# Patient Record
Sex: Female | Born: 1955 | Race: White | Hispanic: No | Smoking: Current every day smoker
Health system: Southern US, Community
[De-identification: ages and names within clinical notes are randomized; demographics above are authoritative.]

## PROBLEM LIST (undated history)

## (undated) DIAGNOSIS — F32A Depression, unspecified: Secondary | ICD-10-CM

## (undated) DIAGNOSIS — G35 Multiple sclerosis: Secondary | ICD-10-CM

## (undated) DIAGNOSIS — E7800 Pure hypercholesterolemia, unspecified: Secondary | ICD-10-CM

## (undated) HISTORY — PX: TONSILLECTOMY: SUR1361

## (undated) HISTORY — DX: Multiple sclerosis: G35

## (undated) HISTORY — PX: BREAST REDUCTION SURGERY: SHX8

## (undated) HISTORY — PX: MASTECTOMY: SHX3

## (undated) HISTORY — PX: LAPAROSCOPIC ABDOMINAL EXPLORATION: SHX6249

## (undated) HISTORY — DX: Pure hypercholesterolemia, unspecified: E78.00

## (undated) HISTORY — PX: OTHER SURGICAL HISTORY: SHX169

## (undated) HISTORY — DX: Depression, unspecified: F32.A

---

## 2006-12-19 DIAGNOSIS — C50919 Malignant neoplasm of unspecified site of unspecified female breast: Secondary | ICD-10-CM

## 2006-12-19 HISTORY — DX: Malignant neoplasm of unspecified site of unspecified female breast: C50.919

## 2020-12-22 ENCOUNTER — Ambulatory Visit (INDEPENDENT_AMBULATORY_CARE_PROVIDER_SITE_OTHER): Payer: Medicare Other | Admitting: Neurology

## 2020-12-22 ENCOUNTER — Encounter: Payer: Self-pay | Admitting: Neurology

## 2020-12-22 ENCOUNTER — Telehealth: Payer: Self-pay | Admitting: Neurology

## 2020-12-22 ENCOUNTER — Other Ambulatory Visit: Payer: Self-pay | Admitting: Neurology

## 2020-12-22 VITALS — BP 171/89 | HR 87 | Ht 64.5 in | Wt 113.6 lb

## 2020-12-22 DIAGNOSIS — Z79899 Other long term (current) drug therapy: Secondary | ICD-10-CM

## 2020-12-22 DIAGNOSIS — M25511 Pain in right shoulder: Secondary | ICD-10-CM | POA: Diagnosis not present

## 2020-12-22 DIAGNOSIS — E785 Hyperlipidemia, unspecified: Secondary | ICD-10-CM

## 2020-12-22 DIAGNOSIS — R5383 Other fatigue: Secondary | ICD-10-CM | POA: Diagnosis not present

## 2020-12-22 DIAGNOSIS — G8929 Other chronic pain: Secondary | ICD-10-CM

## 2020-12-22 DIAGNOSIS — G35 Multiple sclerosis: Secondary | ICD-10-CM | POA: Diagnosis not present

## 2020-12-22 DIAGNOSIS — E559 Vitamin D deficiency, unspecified: Secondary | ICD-10-CM | POA: Diagnosis not present

## 2020-12-22 DIAGNOSIS — R269 Unspecified abnormalities of gait and mobility: Secondary | ICD-10-CM

## 2020-12-22 DIAGNOSIS — M542 Cervicalgia: Secondary | ICD-10-CM

## 2020-12-22 DIAGNOSIS — M79604 Pain in right leg: Secondary | ICD-10-CM

## 2020-12-22 MED ORDER — GILENYA 0.5 MG PO CAPS: 1.0000 | ORAL_CAPSULE | Freq: Every day | ORAL | 3 refills | Status: DC

## 2020-12-22 MED ORDER — ALPRAZOLAM 0.5 MG PO TABS: ORAL_TABLET | ORAL | 0 refills | Status: DC

## 2020-12-22 NOTE — Telephone Encounter (Signed)
Medicare/bcbs supp order sent to GI. No auth they will reach out to the patient to schedule.  

## 2020-12-22 NOTE — Telephone Encounter (Signed)
Prescription sent to this pharmacy

## 2020-12-22 NOTE — Telephone Encounter (Signed)
Pt called, Dr. Epimenio Foot ask me to call back with information on the prescription bottle for Fingolimod HCl (GILENYA) 0.5 MG CAPS. "Rx [facility] Prescription no: [id number] Manufacturer: [employer] no: [phone]." Can contact me if you have any questions.

## 2020-12-22 NOTE — Progress Notes (Signed)
[facility]  PATIENT: Cynthia Hall DOB: [birth year]  REFERRING DOCTOR OR PCP: Otto Herb  MD [facility] neurology) SOURCE: Patient, notes from [facility] neurology, imaging and lab reports, MRI images personally reviewed.  _________________________________   HISTORICAL  CHIEF COMPLAINT:  Chief Complaint  Patient presents with  . New Patient (Initial Visit)    RM 13. Paper referral from Megan Salon for MS. DMT: Gilenya. Has balance/ambulation problems. Uses cane while she is outside. Has hx falls. Having shocking pains in feet daily.     HISTORY OF PRESENT ILLNESS:  I had the pleasure of seeing your patient, Cynthia Hall, at the [place] [facility] at [facility] for neurologic consultation regarding her multiple sclerosis.  She is a 65 year old woman who was diagnosed with MS in 1995 after presenting with right arm numbness and twitching in the arm.  MRI of the brain and spine was consistent with MS.   She felt she did not have MS so did not start any medication.   In 2011 she had more symptoms and had an MRI performed which showed changes c/w MS.   She was initially started on an interferon.   She switched to Copaxone but did not tolerate it well.   She switched to [place] in 2013 and has continued on it.  She feels her MS has been stable and she has no definite exacerbation.  She is reporting pain in her right back and leg to the thigh towards the knee.  She went to the urgent care in October 2021 but due to being on Gilenya, she did not receive any steroid.   Pain is doing better the past month.   An MRI was going to be done but she never heard back to schedule.   She also has a lot of shoulder pain.  Pain is worse when she moves her neck   Currently, she has a reduced balance and has had some falls.   She notes mild right leg weakness.    She has no numbness.   She has urinary urgency and occasional urge incontinence.   She notes reduced visual  acuity, right worse then left.  Even with glasses she is not 20/20.  She notes normal color vision.     She notes no major issues with fatigue.    She sleeps well most nights.   She has noted mild cognitive issues - especially coming up with the right words.   She has had depression but feels mood is currently fine.    She had left breast cancer and had bilateral mastectomy in 2008.   She did not need chemotherapy.    She has high cholesterol.   She does not have a PCP.     Imaging review: MRI of the brain 07/14/2010 shows multiple T2/FLAIR hyperintense foci predominantly in the periventricular white matter, radially oriented to the ventricles.  One focus is also noted in the right middle cerebellar peduncle adjacent to the ventricle.  None of the foci enhance after contrast was administered.  MRI of the cervical spine 07/14/2010 shows T2 hyperintense foci predominantly in the upper cervical spine.  Foci noted towards the right adjacent to C3 to C4C5 and centrally adjacent to C5.   Spinal stenosis (mild ) at [facility] and C6C7.   REVIEW OF SYSTEMS: Constitutional: No fevers, chills, sweats, or change in appetite.  Mild fatigue. Eyes: She notes poor vision.  No diplopia.  No eye pain.   Ear, nose and throat: No  hearing loss, ear pain, nasal congestion, sore throat Cardiovascular: No chest pain, palpitations Respiratory: No shortness of breath at rest or with exertion.   No wheezes GastrointestinaI: No nausea, vomiting, diarrhea, abdominal pain, fecal incontinence Genitourinary: No dysuria, urinary retention or frequency.  No nocturia. Musculoskeletal:Neck pain, back pain and right shoulder pain.   Integumentary: No rash, pruritus, skin lesions Neurological: as above Psychiatric: No depression at this time.  No anxiety Endocrine: No palpitations, diaphoresis, change in appetite, change in weigh or increased thirst Hematologic/Lymphatic: No anemia, purpura, petechiae. Allergic/Immunologic: No  itchy/runny eyes, nasal congestion, recent allergic reactions, rashes  ALLERGIES: Allergies  Allergen Reactions  . Aspirin   . Betaseron [Interferon Beta-1b]     inj site fatigue  . Biaxin [Clarithromycin]   . Codeine   . Copaxone [Glatiramer Acetate]     Passed out, incont of bladder/bowel  . Effexor [Venlafaxine]   . Latex   . Penicillins   . Topamax [Topiramate]   . Zoloft [Sertraline]     HOME MEDICATIONS:  Current Outpatient Medications:  .  COLESTIPOL HCL PO, Take 10 mg by mouth daily., Disp: , Rfl:  .  Fingolimod HCl (GILENYA) 0.5 MG CAPS, Take 1 capsule by mouth daily., Disp: , Rfl:  .  Ginkgo Biloba 120 MG TABS, Take 120 mg by mouth daily., Disp: , Rfl:  .  Multiple Vitamin (MULTIVITAMIN ADULT PO), Take 1 tablet by mouth daily., Disp: , Rfl:  .  Multiple Vitamins-Minerals (ZINC PO), Take 50 mg by mouth daily., Disp: , Rfl:  .  Omega-3 Fatty Acids (FISH OIL PO), Take 1,400 mg by mouth daily., Disp: , Rfl:  .  simvastatin (ZOCOR) 20 MG tablet, Take 20 mg by mouth daily., Disp: , Rfl:   PAST MEDICAL HISTORY: Past Medical History:  Diagnosis Date  . Breast cancer (HCC) 2008  . Multiple sclerosis (HCC)     PAST SURGICAL HISTORY:   FAMILY HISTORY: No family history on file.  SOCIAL HISTORY:  Social History   Socioeconomic History  . Marital status: Widowed    Spouse name: Not on file  . Number of children: Not on file  . Years of education: Not on file  . Highest education level: Not on file  Occupational History  . Occupation: Disabled   Tobacco Use  . Smoking status: Current Every Day Smoker    Packs/day: 1.00  . Smokeless tobacco: Not on file  Substance and Sexual Activity  . Alcohol use: Yes    Comment: 2 drinks per week  . Drug use: Not on file  . Sexual activity: Not on file  Other Topics Concern  . Not on file  Social History Narrative  . Not on file   Social Determinants of Health   Financial Resource Strain: Not on file  Food  Insecurity: Not on file  Transportation Needs: Not on file  Physical Activity: Not on file  Stress: Not on file  Social Connections: Not on file  Intimate Partner Violence: Not on file     PHYSICAL EXAM  Vitals:   12/22/20 1323  Weight: 113 lb 9.6 oz (51.5 kg)  Height: 5' 4.5" (1.638 m)    Body mass index is 19.2 kg/m.   Hearing Screening   125Hz  250Hz  500Hz  1000Hz  2000Hz  3000Hz  4000Hz  6000Hz  8000Hz   Right ear:           Left ear:             Visual Acuity Screening   Right eye Left  eye Both eyes  Without correction:     With correction: 20/50 20/50 20/40      General: The patient is well-developed and well-nourished and in no acute distress  HEENT:  Head is [place]/AT.  Sclera are anicteric.  Funduscopic exam shows normal optic discs and retinal vessels.  Neck: No carotid bruits are noted.  The neck is slightly tender with a mildly reduced range of motion..  Cardiovascular: The heart has a regular rate and rhythm with a normal S1 and S2. There were no murmurs, gallops or rubs.    Skin: Extremities are without rash or  edema.  Musculoskeletal:  Back is nontender.  She is tender over the subacromial bursa of the right shoulder.  Range of motion is reduced in the right shoulder.  Neurologic Exam  Mental status: The patient is alert and oriented x 3 at the time of the examination. The patient has apparent normal recent and remote memory, with an apparently normal attention span and concentration ability.   Speech is normal.  Cranial nerves: Extraocular movements are full. Pupils are equal, round, and reactive to light and accomodation.  Colors symmetric.   Facial symmetry is present. There is good facial sensation to soft touch bilaterally.Facial strength is normal.  Trapezius and sternocleidomastoid strength is normal. No dysarthria is noted.   No obvious hearing deficits are noted.  Motor:  Muscle bulk is normal.   Tone is normal. Strength is  5 / 5 in all 4 extremities.    Sensory: Sensory testing is intact to pinprick, soft touch and vibration sensation in all 4 extremities.  Coordination: Cerebellar testing reveals good finger-nose-finger and heel-to-shin bilaterally.  Gait and station: Station is normal.   Gait is slightly wide. Tandem gait is poor Romberg is negative.   Reflexes: Deep tendon reflexes are symmetric and normal bilaterally in arms and increased at knees.  No ankle clonus. .   Plantar responses are flexor.       ASSESSMENT AND PLAN  Multiple sclerosis ([place]) - Plan: CBC with Differential/Platelet, Comprehensive metabolic panel, TSH, MR CERVICAL SPINE WO CONTRAST, MR BRAIN W WO CONTRAST  Vitamin D deficiency - Plan: VITAMIN D 25 Hydroxy (Vit-D Deficiency, Fractures)  Other fatigue - Plan: TSH  Gait disturbance - Plan: MR CERVICAL SPINE WO CONTRAST, MR BRAIN W WO CONTRAST  Chronic right shoulder pain - Plan: MR CERVICAL SPINE WO CONTRAST  Right leg pain  Neck pain - Plan: MR CERVICAL SPINE WO CONTRAST  High risk medication use - Plan: CBC with Differential/Platelet, Comprehensive metabolic panel, TSH  Hyperlipidemia, unspecified hyperlipidemia type - Plan: Lipid Panel   In summary, Ms. Jewell is a 65 year old woman with multiple sclerosis initially diagnosed in 1995 who has been on medication since 2011.  She has been on Gilenya for about 7 or 8 years and has tolerated it well.  She has not noted any exacerbations.  Therefore, for the time being we will continue this medication.  She has had no recent exacerbations.  She has had some increase in neck pain, right shoulder pain and also was experiencing probable lumbar radiculopathy, since improved.  I will check an MRI of the brain and cervical spine.  Determine if she is having any breakthrough activity.  If this is occurring we would need to consider a different disease modifying therapy.  The MRI of the cervical spine can also help to evaluate the neck and shoulder pain.  She is  tender over the subacromial bursa of the shoulder and  I did a shoulder bursa injection with 40 mg Depo-Medrol and 2.5 cc lidocaine.  She tolerated the procedure well.  A couple minutes later, her pain was much better in the arm and she had improved range of motion.  We discussed having her see orthopedics if shoulder pain worsens.  I will check some blood work including CBC with differential, CMP, TSH and vitamin D.  She is advised to stay active and exercise as tolerated.  She will return to see me in 6 months or sooner if there are new or worsening neurologic symptoms.    Thank you for asking me to see Ms. Landess.  Please let me know can be of further assistance with her or other patients in the future.   Marvel Sapp A. Felecia Shelling, MD, [facility] [id number], [id number] PM Certified in Neurology, Clinical Neurophysiology, Sleep Medicine and Neuroimaging  [facility] [address] [place], [place] [address] [phone]

## 2020-12-23 ENCOUNTER — Telehealth: Payer: Self-pay | Admitting: *Deleted

## 2020-12-23 LAB — CBC WITH DIFFERENTIAL/PLATELET
Basophils Absolute: 0 10*3/uL (ref 0.0–0.2)
Basos: 0 %
EOS (ABSOLUTE): 0 10*3/uL (ref 0.0–0.4)
Eos: 1 %
Hematocrit: 45.5 % (ref 34.0–46.6)
Hemoglobin: 15.7 g/dL (ref 11.1–15.9)
Immature Grans (Abs): 0 10*3/uL (ref 0.0–0.1)
Immature Granulocytes: 0 %
Lymphocytes Absolute: 0.4 10*3/uL — ABNORMAL LOW (ref 0.7–3.1)
Lymphs: 6 %
MCH: 35.2 pg — ABNORMAL HIGH (ref 26.6–33.0)
MCHC: 34.5 g/dL (ref 31.5–35.7)
MCV: 102 fL — ABNORMAL HIGH (ref 79–97)
Monocytes Absolute: 0.7 10*3/uL (ref 0.1–0.9)
Monocytes: 12 %
Neutrophils Absolute: 4.6 10*3/uL (ref 1.4–7.0)
Neutrophils: 81 %
Platelets: 239 10*3/uL (ref 150–450)
RBC: 4.46 x10E6/uL (ref 3.77–5.28)
RDW: 11.8 % (ref 11.7–15.4)
WBC: 5.7 10*3/uL (ref 3.4–10.8)

## 2020-12-23 LAB — LIPID PANEL
Chol/HDL Ratio: 1.8 ratio (ref 0.0–4.4)
Cholesterol, Total: 219 mg/dL — ABNORMAL HIGH (ref 100–199)
HDL: 121 mg/dL (ref >39)
LDL Chol Calc (NIH): 86 mg/dL (ref 0–99)
Triglycerides: 67 mg/dL (ref 0–149)
VLDL Cholesterol Cal: 12 mg/dL (ref 5–40)

## 2020-12-23 LAB — COMPREHENSIVE METABOLIC PANEL
ALT: 40 IU/L — ABNORMAL HIGH (ref 0–32)
AST: 57 IU/L — ABNORMAL HIGH (ref 0–40)
Albumin/Globulin Ratio: 2.1 (ref 1.2–2.2)
Albumin: 4.9 g/dL — ABNORMAL HIGH (ref 3.8–4.8)
Alkaline Phosphatase: 111 IU/L (ref 44–121)
BUN/Creatinine Ratio: 8 — ABNORMAL LOW (ref 12–28)
BUN: 5 mg/dL — ABNORMAL LOW (ref 8–27)
Bilirubin Total: 0.7 mg/dL (ref 0.0–1.2)
CO2: 25 mmol/L (ref 20–29)
Calcium: 9.6 mg/dL (ref 8.7–10.3)
Chloride: 96 mmol/L (ref 96–106)
Creatinine, Ser: 0.59 mg/dL (ref 0.57–1.00)
GFR calc Af Amer: 111 mL/min/{1.73_m2} (ref >59)
GFR calc non Af Amer: 96 mL/min/{1.73_m2} (ref >59)
Globulin, Total: 2.3 g/dL (ref 1.5–4.5)
Glucose: 107 mg/dL — ABNORMAL HIGH (ref 65–99)
Potassium: 4.1 mmol/L (ref 3.5–5.2)
Sodium: 137 mmol/L (ref 134–144)
Total Protein: 7.2 g/dL (ref 6.0–8.5)

## 2020-12-23 LAB — VITAMIN D 25 HYDROXY (VIT D DEFICIENCY, FRACTURES): Vit D, 25-Hydroxy: 50.1 ng/mL (ref 30.0–100.0)

## 2020-12-23 LAB — TSH: TSH: 1.01 u[IU]/mL (ref 0.450–4.500)

## 2020-12-23 NOTE — Telephone Encounter (Signed)
-----   Message from Asa Lente, MD sent at 12/23/2020  8:24 AM EST ----- Please let the patient know that the lab work is mostly fine.   The liver tests were slightly elevated but not enough to be concerned about.   If taking a lot of Tylenol or drinking alcohol try to reduce.     The cholesterol was mildly elevated so continue Zocor

## 2020-12-23 NOTE — Telephone Encounter (Signed)
Called and spoke with pt about results per Dr. Epimenio Foot note. Pt verbalized understanding. I made her aware Dr. Epimenio Foot called in Gilenya refill for her after she called yesterday. She is scheduled for MRI 01/08/21. Also made her aware Dr. Epimenio Foot called in xanax for her to take prior to MRI. Went over instructions on how to take this and advised she must have driver to and from test.

## 2021-01-02 ENCOUNTER — Other Ambulatory Visit: Payer: Medicare Other

## 2021-01-08 ENCOUNTER — Other Ambulatory Visit: Payer: Medicare Other

## 2021-01-19 ENCOUNTER — Ambulatory Visit
Admission: RE | Admit: 2021-01-19 | Discharge: 2021-01-19 | Disposition: A | Discharge status: Home / Self Care | Payer: Medicare Other | Source: Ambulatory Visit | Attending: Neurology | Admitting: Neurology

## 2021-01-19 DIAGNOSIS — R269 Unspecified abnormalities of gait and mobility: Secondary | ICD-10-CM

## 2021-01-19 DIAGNOSIS — G35 Multiple sclerosis: Secondary | ICD-10-CM

## 2021-01-19 DIAGNOSIS — G8929 Other chronic pain: Secondary | ICD-10-CM

## 2021-01-19 DIAGNOSIS — M542 Cervicalgia: Secondary | ICD-10-CM

## 2021-01-19 MED ORDER — GADOBENATE DIMEGLUMINE 529 MG/ML IV SOLN
10.0000 mL | Freq: Once | INTRAVENOUS | Status: AC | PRN
  Administered 2021-01-19: 10 mL via INTRAVENOUS

## 2021-01-21 ENCOUNTER — Telehealth: Payer: Self-pay | Admitting: *Deleted

## 2021-01-21 DIAGNOSIS — G8929 Other chronic pain: Secondary | ICD-10-CM

## 2021-01-21 DIAGNOSIS — M542 Cervicalgia: Secondary | ICD-10-CM

## 2021-01-21 MED ORDER — METHYLPREDNISOLONE 4 MG PO TBPK: ORAL_TABLET | ORAL | 0 refills | Status: DC

## 2021-01-21 NOTE — Telephone Encounter (Signed)
Called pt and relayed results per Dr. Felecia Shelling note. She verbalized understanding. However, she said she had imaging ordered by [facility] in 2018 as well. Wants to know if he took a look at these.   She also reports that since he did injection in right shoulder. Since, she is able to lift arm up but cannot pick up a cup of coffee without there being pain. She is in constant pain. Advised I will speak with MD and call back with recommendation.  I reviewed Dr. Felecia Shelling last note. He mentioned: "She is tender over the subacromial bursa of the shoulder and I did a shoulder bursa injection with 40 mg Depo-Medrol and 2.5 cc lidocaine.  She tolerated the procedure well.  A couple minutes later, her pain was much better in the arm and she had improved range of motion.  We discussed having her see orthopedics if shoulder pain worsens."

## 2021-01-21 NOTE — Telephone Encounter (Addendum)
Spoke with Dr. Felecia Shelling, he is going to see if Cynthia Hall can get copy of 2018 MRI report/CD to review. The good thing is that there is no progression when compared to 2011. Gilenya is working well for her and she should continue this. Due to continued pain in shoulder, he would like to refer her to orthopaedics for further evaluation.  I called pt and relayed above message. She verbalized understanding. She also mentioned she is having R sciatic pain. I placed her on hold and spoke with MD. Cynthia Hall per MD that we will send in steroid pack for her to take. If sx have not improved by next week, she is to call and Dr. Felecia Shelling will order further imaging on her.  She advised she has "had a lot of imaging in the past". Unsure if she has had MRI lumbar/thoracic. Advised we will include this in request to [facility] when we ask for 2018 imaging. She verbalized understanding.

## 2021-01-21 NOTE — Telephone Encounter (Signed)
-----   Message from Britt Bottom, MD sent at 01/20/2021  6:22 PM EST ----- Please let her know that I took a look at the MRI that she just had and compared it to the MRI she had in 2011 at [facility] neurology.  The MRI of the brain showed no new lesions compared to 2011 though there is some more atrophy.  Therefore, she should continue on the Gilenya since it has prevented new lesions.  The MRI of the cervical spine did not show any new lesions (she has 69 old ones that were present in 2011) she does have spinal stenosis at C5-C6 that has slightly progressed compared to 2011 and could be related to some of her neck pain.

## 2021-01-21 NOTE — Addendum Note (Signed)
Addended by: Wyvonnia Lora on: 01/21/2021 09:25 AM   Modules accepted: Orders

## 2021-01-21 NOTE — Telephone Encounter (Signed)
Request faxed to [facility] [phone] request mri cd and reports from 2018.

## 2021-02-08 NOTE — Telephone Encounter (Signed)
I received reports of an MRI of the brain from 10/09/2017 performed in [place].  It was reportedly stable compared to the MRI from 05/09/2014.  MRI of the cervical spine 10/09/2017 showed a focus from C3-C5, unchanged compared to 2015 and chronic degenerative changes throughout the spine that were also felt to be stable

## 2021-05-14 ENCOUNTER — Telehealth: Payer: Self-pay | Admitting: Neurology

## 2021-05-14 NOTE — Telephone Encounter (Signed)
Pt called needing a 90 day supply refill on her simvastatin (ZOCOR) 20 MG tablet sent in to the [facility] in [place] on [address]

## 2021-05-19 ENCOUNTER — Other Ambulatory Visit: Payer: Self-pay | Admitting: Emergency Medicine

## 2021-05-19 MED ORDER — SIMVASTATIN 20 MG PO TABS: 20.0000 mg | ORAL_TABLET | Freq: Every day | ORAL | 0 refills | Status: DC

## 2021-05-19 NOTE — Telephone Encounter (Signed)
Called patient and let her know her prescription was ready and could be picked up at her convenience.  Marland Kitchen

## 2021-05-19 NOTE — Telephone Encounter (Signed)
Pt has called to check on the status of the refill  simvastatin (ZOCOR) 20 MG tablet TO [facility]

## 2021-06-23 ENCOUNTER — Telehealth: Payer: Self-pay | Admitting: Neurology

## 2021-06-23 ENCOUNTER — Other Ambulatory Visit: Payer: Self-pay | Admitting: Neurology

## 2021-06-23 MED ORDER — SIMVASTATIN 20 MG PO TABS: 20.0000 mg | ORAL_TABLET | Freq: Every day | ORAL | 0 refills | Status: DC

## 2021-06-23 NOTE — Telephone Encounter (Signed)
Patient would like a 90 day supply of Simbastatin 20mg  called into the pharmacy. Patient usually gets a 90 day supply but for month of June it was only 30 days with no refills. Is there a reason why the change was made from 90 day to 30 day? Patient will be in on 7/12 for her visit but doesn't have enough pills until then(2 left). Thank you

## 2021-06-23 NOTE — Telephone Encounter (Signed)
Refill sent in for the pt

## 2021-06-23 NOTE — Telephone Encounter (Signed)
Refill has been sent to the pharmacy.  

## 2021-06-29 ENCOUNTER — Ambulatory Visit (INDEPENDENT_AMBULATORY_CARE_PROVIDER_SITE_OTHER): Payer: Medicare Other | Admitting: Neurology

## 2021-06-29 ENCOUNTER — Encounter: Payer: Self-pay | Admitting: Neurology

## 2021-06-29 VITALS — BP 175/85 | HR 84 | Ht 64.5 in | Wt 112.5 lb

## 2021-06-29 DIAGNOSIS — R269 Unspecified abnormalities of gait and mobility: Secondary | ICD-10-CM | POA: Insufficient documentation

## 2021-06-29 DIAGNOSIS — E785 Hyperlipidemia, unspecified: Secondary | ICD-10-CM

## 2021-06-29 DIAGNOSIS — G35 Multiple sclerosis: Secondary | ICD-10-CM | POA: Diagnosis not present

## 2021-06-29 DIAGNOSIS — M25511 Pain in right shoulder: Secondary | ICD-10-CM

## 2021-06-29 DIAGNOSIS — G8929 Other chronic pain: Secondary | ICD-10-CM | POA: Insufficient documentation

## 2021-06-29 DIAGNOSIS — Z79899 Other long term (current) drug therapy: Secondary | ICD-10-CM | POA: Insufficient documentation

## 2021-06-29 NOTE — Progress Notes (Signed)
[facility]  PATIENT: Cynthia Hall DOB: [birth year]  REFERRING DOCTOR OR PCP: Otto Herb  MD [facility] neurology) SOURCE: Patient, notes from [facility] neurology, imaging and lab reports, MRI images personally reviewed.  _________________________________   HISTORICAL  CHIEF COMPLAINT:  Chief Complaint  Patient presents with   Follow-up    Rm 1, alone. Pt here for MS f/u, pt reports being stable. Pt would like to have refills for her stain.     HISTORY OF PRESENT ILLNESS:  I had the pleasure of seeing your patient, Cynthia Hall, at the [place] [facility] at [facility] for neurologic consultation regarding her multiple sclerosis.  Update 06/29/2021: She is on Gilenya and tolerates it well.   She has patient assistance.     Gait is about the same  She has fallen a few times due to reduced balance.   She has mild leg weakness and mild right arm and shoulder weakness.     She has no numbness in arms but she has dysesthesias in her feet.  .   She has urinary urgency and occasional urge incontinence.   She notes reduced visual acuity, right worse then left.  Even with glasses she is not 20/20.  She notes normal color vision.     She notes no major issues with fatigue.    She sleeps well most nights.   She has noted mild cognitive issues - especially coming up with the right words.   She has had depression but feels mood is currently fine.    At the last visit I did a right subacromial bursa injection.  Pain and ROM are still doing much better since.      MS History: She was diagnosed with MS in 1995 after presenting with right arm numbness and twitching in the arm.  MRI of the brain and spine was consistent with MS.   She felt she did not have MS so did not start any medication.   In 2011 she had more symptoms and had an MRI performed which showed changes c/w MS.   She was initially started on an interferon.   She switched to Copaxone but did not  tolerate it well.   She switched to [place] in 2013 and has continued on it.  She feels her MS has been stable and she has no definite exacerbation.   She had left breast cancer and had bilateral mastectomy in 2008.   She did not need chemotherapy.    She has high cholesterol.   She does not have a PCP.     Imaging review: MRI of the brain 07/14/2010 shows multiple T2/FLAIR hyperintense foci predominantly in the periventricular white matter, radially oriented to the ventricles.  One focus is also noted in the right middle cerebellar peduncle adjacent to the ventricle.  None of the foci enhance after contrast was administered.  MRI of the cervical spine 07/14/2010 shows T2 hyperintense foci predominantly in the upper cervical spine.  Foci noted towards the right adjacent to C3 to C4C5 and centrally adjacent to C5.   Spinal stenosis (mild ) at [facility] and C6C7.   MRI cervical spine 01/19/2021 showed T2 hyperintense foci adjacent to C3-C4, C4-C5 and C5-C6 as detailed above.  None of these appear to be acute.   They were all present on the MRI from 2011 and are consistent with chronic demyelinating plaque associated with multiple sclerosis. 2.   Mild to moderate spinal stenosis and foraminal narrowing at C5-C6 due to  degenerative changes that have slightly progressed compared to the 2011 MRI. 3.   Milder degenerative changes at C4-C5 and C6-C7 that are stable.  There is no spinal stenosis or nerve root compression at these levels.  MRI brain 01/19/2021 showed Extensive T2/FLAIR single and confluent hyperintense foci predominantly in the periventricular white matter of the hemispheres.  2 small foci also noted in the medulla.  The extent of the white matter foci is essentially unchanged compared to the 2011 MRI. 2.    Moderate generalized cortical atrophy, corpus callosum atrophy and cerebellar atrophy that has progressed compared to the 2011 MRI.       REVIEW OF SYSTEMS: Constitutional: No fevers, chills,  sweats, or change in appetite.  Mild fatigue. Eyes: She notes poor vision.  No diplopia.  No eye pain.   Ear, nose and throat: No hearing loss, ear pain, nasal congestion, sore throat Cardiovascular: No chest pain, palpitations Respiratory:  No shortness of breath at rest or with exertion.   No wheezes GastrointestinaI: No nausea, vomiting, diarrhea, abdominal pain, fecal incontinence Genitourinary:  No dysuria, urinary retention or frequency.  No nocturia. Musculoskeletal: Neck pain, back pain and right shoulder pain.   Integumentary: No rash, pruritus, skin lesions Neurological: as above Psychiatric: No depression at this time.  No anxiety Endocrine: No palpitations, diaphoresis, change in appetite, change in weigh or increased thirst Hematologic/Lymphatic:  No anemia, purpura, petechiae. Allergic/Immunologic: No itchy/runny eyes, nasal congestion, recent allergic reactions, rashes  ALLERGIES: Allergies  Allergen Reactions   Aspirin    Betaseron [Interferon Beta-1b]     inj site fatigue   Biaxin [Clarithromycin]    Codeine    Copaxone [Glatiramer Acetate]     Passed out, incont of bladder/bowel   Effexor [Venlafaxine]    Latex    Penicillins    Topamax [Topiramate]    Zoloft [Sertraline]     HOME MEDICATIONS:  Current Outpatient Medications:    ALPRAZolam (XANAX) 0.5 MG tablet, Take two to three po before the MRI, Disp: 3 tablet, Rfl: 0   B Complex Vitamins (B COMPLEX PO), Take 1 Dose by mouth daily., Disp: , Rfl:    Cholecalciferol (VITAMIN D3 PO), Take 2,000 Units by mouth daily., Disp: , Rfl:    Fingolimod HCl (GILENYA) 0.5 MG CAPS, Take 1 capsule (0.5 mg total) by mouth daily., Disp: 90 capsule, Rfl: 3   Ginkgo Biloba 120 MG TABS, Take 120 mg by mouth daily., Disp: , Rfl:    methylPREDNISolone (MEDROL DOSEPAK) 4 MG TBPK tablet, Take 6 tablets on day 1 and decrease by 1 tablet each day until finished, Disp: 21 tablet, Rfl: 0   Multiple Vitamin (MULTIVITAMIN ADULT PO),  Take 1 tablet by mouth daily., Disp: , Rfl:    Multiple Vitamins-Minerals (ZINC PO), Take 50 mg by mouth daily., Disp: , Rfl:    Omega-3 Fatty Acids (FISH OIL PO), Take 1,400 mg by mouth daily., Disp: , Rfl:    Probiotic Product (PROBIOTIC PO), Take 1 Dose by mouth daily., Disp: , Rfl:    simvastatin (ZOCOR) 20 MG tablet, Take 1 tablet (20 mg total) by mouth daily., Disp: 90 tablet, Rfl: 0  PAST MEDICAL HISTORY: Past Medical History:  Diagnosis Date   Breast cancer ([place]) 2008   Depression    High cholesterol    Multiple sclerosis ([place])     PAST SURGICAL HISTORY:   FAMILY HISTORY: Family History  Problem Relation Age of Onset   Asthma Mother    Heart attack Father  SOCIAL HISTORY:  Social History   Socioeconomic History   Marital status: Widowed    Spouse name: Not on file   Number of children: 0   Years of education: Some college   Highest education level: Not on file  Occupational History   Occupation: Retired  Tobacco Use   Smoking status: Every Day    Packs/day: 1.50    Pack years: 0.00    Types: Cigarettes   Smokeless tobacco: Never  Substance and Sexual Activity   Alcohol use: Yes    Comment: 2 drinks per week   Drug use: Never   Sexual activity: Not on file  Other Topics Concern   Not on file  Social History Narrative   Right handed   No caffeine use   Lives alone   Social Determinants of Health   Financial Resource Strain: Not on file  Food Insecurity: Not on file  Transportation Needs: Not on file  Physical Activity: Not on file  Stress: Not on file  Social Connections: Not on file  Intimate Partner Violence: Not on file     PHYSICAL EXAM  Vitals:   06/29/21 1448  BP: (!) 175/85  Pulse: 84  Weight: 112 lb 8 oz (51 kg)  Height: 5' 4.5" (1.638 m)    Body mass index is 19.01 kg/m.  No results found.    General: The patient is well-developed and well-nourished and in no acute distress  HEENT:  Head is [place]/AT.  Sclera are  anicteric.    Neck: No carotid bruits are noted.  The neck is non-tender with normal educed range of motion..  Skin: Extremities are without rash or  edema.  Musculoskeletal:  Shoulder now with normal ROM and is non-tender  Neurologic Exam  Mental status: The patient is alert and oriented x 3 at the time of the examination. The patient has apparent normal recent and remote memory, with an apparently normal attention span and concentration ability.   Speech is normal.  Cranial nerves: Extraocular movements are full. Facial strength and sensation are normal.   No obvious hearing deficits are noted.  Motor:  Muscle bulk is normal.   Tone is normal. Strength is  5 / 5 in all 4 extremities.   Sensory: Sensory testing is intact to pinprick, soft touch and vibration sensation in all 4 extremities.  Coordination: Cerebellar testing reveals good finger-nose-finger and heel-to-shin bilaterally.  Gait and station: Station is normal.   Gait is slightly wide. Tandem gait is  very poor.  Romberg is negative.   Reflexes: Deep tendon reflexes are symmetric and normal bilaterally in arms and increased at knees.  No ankle clonus.         ASSESSMENT AND PLAN  Multiple sclerosis (HCC)  Chronic right shoulder pain  Gait disturbance  Hyperlipidemia, unspecified hyperlipidemia type  High risk medication use    Continue Gilenya.   Check labs I will write a few months of her simvastatin but I again advised her she needs a PCP as I cannot handle her health screens or many issues that might arise.   Rtc 6 months, sooner if new or worsening issues.    Herson Prichard A. Felecia Shelling, MD, [facility] 3/66/4403, 4:74 PM Certified in Neurology, Clinical Neurophysiology, Sleep Medicine and Neuroimaging  [facility] [address] [place], [place] [address] [phone]

## 2021-06-30 ENCOUNTER — Telehealth: Payer: Self-pay | Admitting: Neurology

## 2021-06-30 LAB — CBC WITH DIFFERENTIAL/PLATELET
Basophils Absolute: 0 10*3/uL (ref 0.0–0.2)
Basos: 0 %
EOS (ABSOLUTE): 0 10*3/uL (ref 0.0–0.4)
Eos: 1 %
Hematocrit: 42.9 % (ref 34.0–46.6)
Hemoglobin: 15.3 g/dL (ref 11.1–15.9)
Immature Grans (Abs): 0 10*3/uL (ref 0.0–0.1)
Immature Granulocytes: 0 %
Lymphocytes Absolute: 0.5 10*3/uL — ABNORMAL LOW (ref 0.7–3.1)
Lymphs: 11 %
MCH: 35.8 pg — ABNORMAL HIGH (ref 26.6–33.0)
MCHC: 35.7 g/dL (ref 31.5–35.7)
MCV: 101 fL — ABNORMAL HIGH (ref 79–97)
Monocytes Absolute: 0.8 10*3/uL (ref 0.1–0.9)
Monocytes: 16 %
Neutrophils Absolute: 3.3 10*3/uL (ref 1.4–7.0)
Neutrophils: 72 %
Platelets: 249 10*3/uL (ref 150–450)
RBC: 4.27 x10E6/uL (ref 3.77–5.28)
RDW: 12 % (ref 11.7–15.4)
WBC: 4.6 10*3/uL (ref 3.4–10.8)

## 2021-06-30 LAB — COMPREHENSIVE METABOLIC PANEL
ALT: 36 IU/L — ABNORMAL HIGH (ref 0–32)
AST: 52 IU/L — ABNORMAL HIGH (ref 0–40)
Albumin/Globulin Ratio: 1.9 (ref 1.2–2.2)
Albumin: 5 g/dL — ABNORMAL HIGH (ref 3.8–4.8)
Alkaline Phosphatase: 155 IU/L — ABNORMAL HIGH (ref 44–121)
BUN/Creatinine Ratio: 9 — ABNORMAL LOW (ref 12–28)
BUN: 5 mg/dL — ABNORMAL LOW (ref 8–27)
Bilirubin Total: 0.5 mg/dL (ref 0.0–1.2)
CO2: 25 mmol/L (ref 20–29)
Calcium: 9.8 mg/dL (ref 8.7–10.3)
Chloride: 98 mmol/L (ref 96–106)
Creatinine, Ser: 0.54 mg/dL — ABNORMAL LOW (ref 0.57–1.00)
Globulin, Total: 2.6 g/dL (ref 1.5–4.5)
Glucose: 95 mg/dL (ref 65–99)
Potassium: 4.7 mmol/L (ref 3.5–5.2)
Sodium: 140 mmol/L (ref 134–144)
Total Protein: 7.6 g/dL (ref 6.0–8.5)
eGFR: 102 mL/min/{1.73_m2} (ref >59)

## 2021-06-30 LAB — LIPID PANEL
Chol/HDL Ratio: 2.1 ratio (ref 0.0–4.4)
Cholesterol, Total: 197 mg/dL (ref 100–199)
HDL: 94 mg/dL (ref >39)
LDL Chol Calc (NIH): 89 mg/dL (ref 0–99)
Triglycerides: 77 mg/dL (ref 0–149)
VLDL Cholesterol Cal: 14 mg/dL (ref 5–40)

## 2021-06-30 NOTE — Telephone Encounter (Signed)
Called the patient and reviewed the lab results with her. Advised of the liver function studies being slightly elevated. Informed the patient that if she drinks alcohol or takes tylenol he would encourage cutting back on that to decrease the blood work. Otherwise blood work looked good and he didn't see anything concerning. Pt verbalized understanding. Pt had no questions at this time but was encouraged to call back if questions arise.

## 2021-06-30 NOTE — Telephone Encounter (Signed)
-----   Message from Britt Bottom, MD sent at 06/30/2021  8:21 AM EDT ----- Please let her know that the labs mostly look good.  2 of the liver tests were slightly high but not high enough to worry about.  However, if you take a lot of Tylenol or drink more than occasionally, try to cut back.  The lipid panel was normal.  Lymphocytes were a little bit low but that is typical for Gilenya

## 2021-10-21 ENCOUNTER — Other Ambulatory Visit: Payer: Self-pay | Admitting: Physician Assistant

## 2021-10-21 DIAGNOSIS — E2839 Other primary ovarian failure: Secondary | ICD-10-CM

## 2021-10-21 DIAGNOSIS — N959 Unspecified menopausal and perimenopausal disorder: Secondary | ICD-10-CM

## 2021-10-25 ENCOUNTER — Telehealth: Payer: Self-pay | Admitting: Neurology

## 2021-10-25 MED ORDER — FINGOLIMOD HCL 0.5 MG PO CAPS: 1.0000 | ORAL_CAPSULE | Freq: Every day | ORAL | 3 refills | Status: DC

## 2021-10-25 NOTE — Telephone Encounter (Signed)
Called the patient back and advised that I had not received her forms for the [employer] pt assistance yet. She thinks it may had went to [place] [facility]. Pt thought she would need a sooner apt to be able to have this completed but since she was seen in July and we have the information we should be able to submit. Advised the patient to complete her portion of the form and then we will completed ours. She will bring hers here for [place] to send all at once. Advised I would do that. She will need a refill of the medication printed and faxed at same time. She is asking for a 90 day supply. Advised I would do that. Will wait until she drops the forms off. Pt verbalized understanding. We kept the apt 12/21 as scheduled with Amy for normal f/u visit.

## 2021-10-25 NOTE — Telephone Encounter (Signed)
Pt called, need paperwork for [employer] [facility] [place] filled out before appt on 12/08/21. Have cancelled that appt need sooner appt on 12/12, 12/13, 12/14 for them to pay for my Gilenya. Would like a call from the nurse.

## 2021-11-10 NOTE — Telephone Encounter (Signed)
Called pt. She will plan to drop off her completed portion of application next week. Then we can turn it all in.

## 2021-11-15 NOTE — Telephone Encounter (Signed)
Faxed completed application to [employer] at [phone]. Received fax confirmation.

## 2021-11-30 ENCOUNTER — Ambulatory Visit
Admission: RE | Admit: 2021-11-30 | Discharge: 2021-11-30 | Disposition: A | Discharge status: Home / Self Care | Payer: Medicare Other | Source: Ambulatory Visit | Attending: Physician Assistant | Admitting: Physician Assistant

## 2021-11-30 ENCOUNTER — Other Ambulatory Visit: Payer: Self-pay

## 2021-11-30 DIAGNOSIS — E2839 Other primary ovarian failure: Secondary | ICD-10-CM

## 2021-12-08 ENCOUNTER — Telehealth: Payer: Self-pay | Admitting: *Deleted

## 2021-12-08 ENCOUNTER — Encounter: Payer: Self-pay | Admitting: Family Medicine

## 2021-12-08 ENCOUNTER — Ambulatory Visit (INDEPENDENT_AMBULATORY_CARE_PROVIDER_SITE_OTHER): Payer: Medicare Other | Admitting: Family Medicine

## 2021-12-08 VITALS — BP 188/89 | HR 79 | Ht 64.5 in | Wt 114.5 lb

## 2021-12-08 DIAGNOSIS — E559 Vitamin D deficiency, unspecified: Secondary | ICD-10-CM | POA: Diagnosis not present

## 2021-12-08 DIAGNOSIS — Z79899 Other long term (current) drug therapy: Secondary | ICD-10-CM

## 2021-12-08 DIAGNOSIS — G35 Multiple sclerosis: Secondary | ICD-10-CM

## 2021-12-08 DIAGNOSIS — E785 Hyperlipidemia, unspecified: Secondary | ICD-10-CM | POA: Diagnosis not present

## 2021-12-08 NOTE — Telephone Encounter (Signed)
[place] and spoke w/ Amy. Confirmed pt PCP: Dr. Nelda Bucks there. She has also seen Tommie Raymond. Last saw Dr Delena Bali 11/03/21. Last saw Serena Colonel 07/09/21 and 10/20/21.  Faxed [facility] notes from visit today with AL,NP to them at [phone]. Received fax confirmation.

## 2021-12-08 NOTE — Patient Instructions (Signed)
Below is our plan:  We will continue Gilenya. Please check in with PCP as directed for cholesterol and BP follow up. I will update labs, today. Keep an eye on your BP. Please call PCP if it remains elevated.   Please make sure you are staying well hydrated. I recommend 50-60 ounces daily. Well balanced diet and regular exercise encouraged. Consistent sleep schedule with 6-8 hours recommended.   Please continue follow up with care team as directed.   Follow up with Dr Felecia Shelling in 6 months   You may receive a survey regarding today's visit. I encourage you to leave honest feed back as I do use this information to improve patient care. Thank you for seeing me today!

## 2021-12-08 NOTE — Progress Notes (Signed)
Chief Complaint  Patient presents with   Follow-up    RM 1, alone. Last seen 06/29/21. MS DMT-Gilenya.      HISTORY OF PRESENT ILLNESS:  12/08/21 ALL:  Cynthia Hall is a 65 y.o. female here today for follow up for  MS. She continues Gilenya. Liver enzymes were mildly elevated 06/2021. Lymphocyte count 0.5.   She reports doing well. No new or exacerbating symptoms. She does endorse some imbalance. She has had several falls over the past year. She usually loses her balance and falls to the side. She does not use an assistive device and does not feel it would help. No injuries.   She is sleeping well. She lives alone. She feels mood is good. No vision or bowel/bladder changes.   She is seeing [facility] for PCP needs. She had a recent DEXA that showed osteoporosis. She was started on alendronate. She continues simvastatin. She states that labs were not drawn at last visit. She does not check BP at home. She does not feel BP is usually elevated. She is asymptomatic, today. She continues vitamin D supplements.    HISTORY (copied from Dr Garth Bigness previous note)  I had the pleasure of seeing your patient, Cynthia Hall, at the [place] center at [facility] for neurologic consultation regarding her multiple sclerosis.   Update 06/29/2021: She is on Gilenya and tolerates it well.   She has patient assistance.      Gait is about the same  She has fallen a few times due to reduced balance.   She has mild leg weakness and mild right arm and shoulder weakness.     She has no numbness in arms but she has dysesthesias in her feet.  .   She has urinary urgency and occasional urge incontinence.   She notes reduced visual acuity, right worse then left.  Even with glasses she is not 20/20.  She notes normal color vision.      She notes no major issues with fatigue.    She sleeps well most nights.   She has noted mild cognitive issues - especially coming up with the right  words.   She has had depression but feels mood is currently fine.     At the last visit I did a right subacromial bursa injection.  Pain and ROM are still doing much better since.       MS History: She was diagnosed with MS in 1995 after presenting with right arm numbness and twitching in the arm.  MRI of the brain and spine was consistent with MS.   She felt she did not have MS so did not start any medication.   In 2011 she had more symptoms and had an MRI performed which showed changes c/w MS.   She was initially started on an interferon.   She switched to Copaxone but did not tolerate it well.   She switched to [place] in 2013 and has continued on it.  She feels her MS has been stable and she has no definite exacerbation.   She had left breast cancer and had bilateral mastectomy in 2008.   She did not need chemotherapy.    She has high cholesterol.   She does not have a PCP.     Imaging review: MRI of the brain 07/14/2010 shows multiple T2/FLAIR hyperintense foci predominantly in the periventricular white matter, radially oriented to the ventricles.  One focus is also noted in the right  middle cerebellar peduncle adjacent to the ventricle.  None of the foci enhance after contrast was administered.   MRI of the cervical spine 07/14/2010 shows T2 hyperintense foci predominantly in the upper cervical spine.  Foci noted towards the right adjacent to C3 to C4C5 and centrally adjacent to C5.   Spinal stenosis (mild ) at [facility] and C6C7.    MRI cervical spine 01/19/2021 showed T2 hyperintense foci adjacent to C3-C4, C4-C5 and C5-C6 as detailed above.  None of these appear to be acute.   They were all present on the MRI from 2011 and are consistent with chronic demyelinating plaque associated with multiple sclerosis. 2.   Mild to moderate spinal stenosis and foraminal narrowing at C5-C6 due to degenerative changes that have slightly progressed compared to the 2011 MRI. 3.   Milder degenerative changes at C4-C5  and C6-C7 that are stable.  There is no spinal stenosis or nerve root compression at these levels.   MRI brain 01/19/2021 showed Extensive T2/FLAIR single and confluent hyperintense foci predominantly in the periventricular white matter of the hemispheres.  2 small foci also noted in the medulla.  The extent of the white matter foci is essentially unchanged compared to the 2011 MRI. 2.    Moderate generalized cortical atrophy, corpus callosum atrophy and cerebellar atrophy that has progressed compared to the 2011 MRI.   REVIEW OF SYSTEMS: Out of a complete 14 system review of symptoms, the patient complains only of the following symptoms, imbalance, falls, and all other reviewed systems are negative.   ALLERGIES: Allergies  Allergen Reactions   Aspirin    Betaseron [Interferon Beta-1b]     inj site fatigue   Biaxin [Clarithromycin]    Codeine    Copaxone [Glatiramer Acetate]     Passed out, incont of bladder/bowel   Effexor [Venlafaxine]    Latex    Penicillins    Sulfa Antibiotics Itching   Topamax [Topiramate]    Zoloft [Sertraline]      HOME MEDICATIONS: Outpatient Medications Prior to Visit  Medication Sig Dispense Refill   alendronate (FOSAMAX) 70 MG tablet Take 70 mg by mouth once a week. Take with a full glass of water on an empty stomach.     B Complex Vitamins (B COMPLEX PO) Take 1 Dose by mouth daily.     Cholecalciferol (VITAMIN D3 PO) Take 2,000 Units by mouth daily.     Fingolimod HCl (GILENYA) 0.5 MG CAPS Take 1 capsule (0.5 mg total) by mouth daily. 90 capsule 3   Ginkgo Biloba 120 MG TABS Take 120 mg by mouth daily.     Multiple Vitamin (MULTIVITAMIN ADULT PO) Take 1 tablet by mouth daily.     Multiple Vitamins-Minerals (ZINC PO) Take 50 mg by mouth daily.     Omega-3 Fatty Acids (FISH OIL PO) Take 1,400 mg by mouth daily.     Probiotic Product (PROBIOTIC PO) Take 1 Dose by mouth daily.     simvastatin (ZOCOR) 20 MG tablet Take 1 tablet (20 mg total) by mouth  daily. 90 tablet 0   ALPRAZolam (XANAX) 0.5 MG tablet Take two to three po before the MRI 3 tablet 0   methylPREDNISolone (MEDROL DOSEPAK) 4 MG TBPK tablet Take 6 tablets on day 1 and decrease by 1 tablet each day until finished 21 tablet 0   No facility-administered medications prior to visit.     PAST MEDICAL HISTORY: Past Medical History:  Diagnosis Date   Breast cancer ([place]) 2008  Depression    High cholesterol    Multiple sclerosis ([place])      PAST SURGICAL HISTORY: Past Surgical History:  Procedure Laterality Date   bladder tack up     BREAST REDUCTION SURGERY     LAPAROSCOPIC ABDOMINAL EXPLORATION     lymph node removal     MASTECTOMY Bilateral    TONSILLECTOMY       FAMILY HISTORY: Family History  Problem Relation Age of Onset   Asthma Mother    Heart attack Father      SOCIAL HISTORY: Social History   Socioeconomic History   Marital status: Widowed    Spouse name: Not on file   Number of children: 0   Years of education: Some college   Highest education level: Not on file  Occupational History   Occupation: Retired  Tobacco Use   Smoking status: Every Day    Packs/day: 1.50    Types: Cigarettes   Smokeless tobacco: Never  Substance and Sexual Activity   Alcohol use: Yes    Comment: 2 drinks per week   Drug use: Never   Sexual activity: Not on file  Other Topics Concern   Not on file  Social History Narrative   Right handed   No caffeine use   Lives alone   Social Determinants of Health   Financial Resource Strain: Not on file  Food Insecurity: Not on file  Transportation Needs: Not on file  Physical Activity: Not on file  Stress: Not on file  Social Connections: Not on file  Intimate Partner Violence: Not on file     PHYSICAL EXAM  Vitals:   12/08/21 1407  BP: (!) 188/89  Pulse: 79  Weight: 114 lb 8 oz (51.9 kg)  Height: 5' 4.5" (1.638 m)   Body mass index is 19.35 kg/m.  Generalized: Well developed, in no acute  distress  Cardiology: normal rate and rhythm, no murmur auscultated  Respiratory: clear to auscultation bilaterally    Neurological examination  Mentation: Alert oriented to time, place, history taking. Follows all commands speech and language fluent Cranial nerve II-XII: Pupils were equal round reactive to light. Extraocular movements were full, visual field were full on confrontational test. Facial sensation and strength were normal. Head turning and shoulder shrug  were normal and symmetric. Motor: The motor testing reveals 5 over 5 strength of all 4 extremities. Good symmetric motor tone is noted throughout.  Sensory: Sensory testing is intact to soft touch on all 4 extremities. No evidence of extinction is noted.  Coordination: Cerebellar testing reveals good finger-nose-finger and heel-to-shin bilaterally.  Gait and station: Gait is mildly unsteady but stable without assistive device. Unable to Tandem  Reflexes: Deep tendon reflexes are symmetric and normal bilaterally.    DIAGNOSTIC DATA (LABS, IMAGING, TESTING) - I reviewed patient records, labs, notes, testing and imaging myself where available.  Lab Results  Component Value Date   WBC 4.6 06/29/2021   HGB 15.3 06/29/2021   HCT 42.9 06/29/2021   MCV 101 (H) 06/29/2021   PLT 249 06/29/2021      Component Value Date/Time   NA 140 06/29/2021 1541   K 4.7 06/29/2021 1541   CL 98 06/29/2021 1541   CO2 25 06/29/2021 1541   GLUCOSE 95 06/29/2021 1541   BUN 5 (L) 06/29/2021 1541   CREATININE 0.54 (L) 06/29/2021 1541   CALCIUM 9.8 06/29/2021 1541   PROT 7.6 06/29/2021 1541   ALBUMIN 5.0 (H) 06/29/2021 1541   AST 52 (  H) 06/29/2021 1541   ALT 36 (H) 06/29/2021 1541   ALKPHOS 155 (H) 06/29/2021 1541   BILITOT 0.5 06/29/2021 1541   GFRNONAA 96 12/22/2020 1427   GFRAA 111 12/22/2020 1427   Lab Results  Component Value Date   CHOL 197 06/29/2021   HDL 94 06/29/2021   LDLCALC 89 06/29/2021   TRIG 77 06/29/2021   CHOLHDL  2.1 06/29/2021   No results found for: HGBA1C No results found for: VITAMINB12 Lab Results  Component Value Date   TSH 1.010 12/22/2020    No flowsheet data found.   No flowsheet data found.   ASSESSMENT AND PLAN  65 y.o. year old female  has a past medical history of Breast cancer ([place]) (2008), Depression, High cholesterol, and Multiple sclerosis ([place]). here with    Multiple sclerosis ([place]) - Plan: CBC with Differential/Platelets, CMP  High risk medication use  Hyperlipidemia, unspecified hyperlipidemia type - Plan: CMP, Lipid panel  Vitamin D deficiency - Plan: Vitamin D, 25-hydroxy  Cynthia Hall is doing well, today. MS symptoms are stable. She will continue Gilenya. I will update labs, today. ASL/ALT mildly elevated at last visit. We will update lipids as they were not completed with last PCP visit. She is aware that simvastatin refills and lipid labs will continue through PCP. We have discussed fall risk and precautions were advised. I have encouraged her to use walking stick or cane for stability. Consider PT if symptoms worsen. We have reviewed her BP in office. Repeat BP 180/100. She is asymptomatic. I have encouraged her to notify PCP. I will fax notes as soon as we know who she is seeing. She was advised to seek emergency medical attention immediately for any stroke like symptoms. Low sodium diet discussed. Educational material provided. She will follow up with Dr Felecia Shelling in 6 months, sooner if needed.    Orders Placed This Encounter  Procedures   CBC with Differential/Platelets   CMP   Lipid panel   Vitamin D, 25-hydroxy     No orders of the defined types were placed in this encounter.     Debbora Presto, MSN, FNP-C 12/08/2021, 3:52 PM  [facility] [address], [place], [place] [address] [phone]

## 2021-12-09 LAB — COMPREHENSIVE METABOLIC PANEL
ALT: 25 IU/L (ref 0–32)
AST: 32 IU/L (ref 0–40)
Albumin/Globulin Ratio: 2 (ref 1.2–2.2)
Albumin: 5.1 g/dL — ABNORMAL HIGH (ref 3.8–4.8)
Alkaline Phosphatase: 130 IU/L — ABNORMAL HIGH (ref 44–121)
BUN/Creatinine Ratio: 10 — ABNORMAL LOW (ref 12–28)
BUN: 6 mg/dL — ABNORMAL LOW (ref 8–27)
Bilirubin Total: 0.4 mg/dL (ref 0.0–1.2)
CO2: 25 mmol/L (ref 20–29)
Calcium: 9.9 mg/dL (ref 8.7–10.3)
Chloride: 99 mmol/L (ref 96–106)
Creatinine, Ser: 0.6 mg/dL (ref 0.57–1.00)
Globulin, Total: 2.5 g/dL (ref 1.5–4.5)
Glucose: 88 mg/dL (ref 70–99)
Potassium: 4.9 mmol/L (ref 3.5–5.2)
Sodium: 139 mmol/L (ref 134–144)
Total Protein: 7.6 g/dL (ref 6.0–8.5)
eGFR: 99 mL/min/{1.73_m2} (ref >59)

## 2021-12-09 LAB — CBC WITH DIFFERENTIAL/PLATELET
Basophils Absolute: 0 10*3/uL (ref 0.0–0.2)
Basos: 0 %
EOS (ABSOLUTE): 0.1 10*3/uL (ref 0.0–0.4)
Eos: 1 %
Hematocrit: 41 % (ref 34.0–46.6)
Hemoglobin: 14.8 g/dL (ref 11.1–15.9)
Immature Grans (Abs): 0 10*3/uL (ref 0.0–0.1)
Immature Granulocytes: 0 %
Lymphocytes Absolute: 0.6 10*3/uL — ABNORMAL LOW (ref 0.7–3.1)
Lymphs: 9 %
MCH: 34.7 pg — ABNORMAL HIGH (ref 26.6–33.0)
MCHC: 36.1 g/dL — ABNORMAL HIGH (ref 31.5–35.7)
MCV: 96 fL (ref 79–97)
Monocytes Absolute: 0.8 10*3/uL (ref 0.1–0.9)
Monocytes: 13 %
Neutrophils Absolute: 4.7 10*3/uL (ref 1.4–7.0)
Neutrophils: 77 %
Platelets: 287 10*3/uL (ref 150–450)
RBC: 4.26 x10E6/uL (ref 3.77–5.28)
RDW: 12 % (ref 11.7–15.4)
WBC: 6.2 10*3/uL (ref 3.4–10.8)

## 2021-12-09 LAB — LIPID PANEL
Chol/HDL Ratio: 2.2 ratio (ref 0.0–4.4)
Cholesterol, Total: 198 mg/dL (ref 100–199)
HDL: 90 mg/dL (ref >39)
LDL Chol Calc (NIH): 93 mg/dL (ref 0–99)
Triglycerides: 87 mg/dL (ref 0–149)
VLDL Cholesterol Cal: 15 mg/dL (ref 5–40)

## 2021-12-09 LAB — VITAMIN D 25 HYDROXY (VIT D DEFICIENCY, FRACTURES): Vit D, 25-Hydroxy: 71.9 ng/mL (ref 30.0–100.0)

## 2022-01-04 ENCOUNTER — Telehealth: Payer: Self-pay | Admitting: *Deleted

## 2022-01-04 NOTE — Telephone Encounter (Signed)
I return pt call I was not able to leave a message.

## 2022-06-09 ENCOUNTER — Encounter: Payer: Self-pay | Admitting: Neurology

## 2022-06-09 ENCOUNTER — Ambulatory Visit (INDEPENDENT_AMBULATORY_CARE_PROVIDER_SITE_OTHER): Payer: Medicare Other | Admitting: Neurology

## 2022-06-09 VITALS — BP 168/103 | HR 102 | Ht 64.5 in | Wt 110.5 lb

## 2022-06-09 DIAGNOSIS — G35 Multiple sclerosis: Secondary | ICD-10-CM | POA: Diagnosis not present

## 2022-06-09 DIAGNOSIS — M79645 Pain in left finger(s): Secondary | ICD-10-CM | POA: Diagnosis not present

## 2022-06-09 DIAGNOSIS — R269 Unspecified abnormalities of gait and mobility: Secondary | ICD-10-CM

## 2022-06-09 DIAGNOSIS — Z79899 Other long term (current) drug therapy: Secondary | ICD-10-CM

## 2022-06-09 DIAGNOSIS — M2550 Pain in unspecified joint: Secondary | ICD-10-CM

## 2022-06-09 NOTE — Progress Notes (Signed)
[facility]  PATIENT: Cynthia Hall DOB: [birth year]  REFERRING DOCTOR OR PCP: Otto Herb  MD [facility] neurology) SOURCE: Patient, notes from [facility] neurology, imaging and lab reports, MRI images personally reviewed.  _________________________________   HISTORICAL  CHIEF COMPLAINT:  Chief Complaint  Patient presents with   Follow-up    RM 1, alone. Last seen 12/08/21. MS DMT: Gilenya.     HISTORY OF PRESENT ILLNESS:  Cynthia Hall, is a 66 y.o. woman with multiple sclerosis.  Update 06/09/2022: She is on Gilenya and tolerates it well.  She has no exacerbations.  She feels her gait is doing about the same with reduced balance and mild leg weakness.  She has had some stumbles and rare falls.  She also reports mild right arm and shoulder weakness.     She has no numbness in arms but she has dysesthesias in her feet.  She has urinary urgency and occasional urge incontinence.   She notes reduced visual acuity, right worse then left.  Even with glasses she is not 20/20.  She notes normal color vision.     She notes no major issues with fatigue.    She sleeps well most nights.   She has noted mild cognitive issues - especially coming up with the right words.   She has had depression but feels mood is currently fine.    Over last decade, she has had pain off and on at the base of the left thumb.   It hs bothered her more the past 4-5 weeks and is mildly swollen.  At our initial visit she had had pain in the right shoulder.  A subacromial bursa injection quickly benefited her and she continues to report that range of motion in the shoulder is good and pain is minimal   MS History: She was diagnosed with MS in 1995 after presenting with right arm numbness and twitching in the arm.  MRI of the brain and spine was consistent with MS.   She felt she did not have MS so did not start any medication.   In 2011 she had more symptoms and had an MRI performed which showed  changes c/w MS.   She was initially started on an interferon.   She switched to Copaxone but did not tolerate it well.   She switched to [place] in 2013 and has continued on it.  She feels her MS has been stable and she has no definite exacerbation.  She had left breast cancer and had bilateral mastectomy in 2008.   She did not need chemotherapy.    She has high cholesterol.   She does not have a PCP.     Imaging review: MRI of the brain 07/14/2010 shows multiple T2/FLAIR hyperintense foci predominantly in the periventricular white matter, radially oriented to the ventricles.  One focus is also noted in the right middle cerebellar peduncle adjacent to the ventricle.  None of the foci enhance after contrast was administered.  MRI of the cervical spine 07/14/2010 shows T2 hyperintense foci predominantly in the upper cervical spine.  Foci noted towards the right adjacent to C3 to C4C5 and centrally adjacent to C5.   Spinal stenosis (mild ) at [facility] and C6C7.   MRI cervical spine 01/19/2021 showed T2 hyperintense foci adjacent to C3-C4, C4-C5 and C5-C6 as detailed above.  None of these appear to be acute.   They were all present on the MRI from 2011 and are consistent with chronic demyelinating plaque associated  with multiple sclerosis..   Mild to moderate spinal stenosis and foraminal narrowing at C5-C6 due to degenerative changes that have slightly progressed compared to the 2011 MRI..   Milder degenerative changes at C4-C5 and C6-C7 that are stable.  There is no spinal stenosis or nerve root compression at these levels.  MRI brain 01/19/2021 showed Extensive T2/FLAIR single and confluent hyperintense foci predominantly in the periventricular white matter of the hemispheres.  2 small foci also noted in the medulla.  The extent of the white matter foci is essentially unchanged compared to the 2011 MRI.Marland Kitchen    Moderate generalized cortical atrophy, corpus callosum atrophy and cerebellar atrophy that has progressed  compared to the 2011 MRI.    REVIEW OF SYSTEMS: Constitutional: No fevers, chills, sweats, or change in appetite.  Mild fatigue. Eyes: She notes poor vision.  No diplopia.  No eye pain.   Ear, nose and throat: No hearing loss, ear pain, nasal congestion, sore throat Cardiovascular: No chest pain, palpitations Respiratory:  No shortness of breath at rest or with exertion.   No wheezes GastrointestinaI: No nausea, vomiting, diarrhea, abdominal pain, fecal incontinence Genitourinary:  No dysuria, urinary retention or frequency.  No nocturia. Musculoskeletal: Neck pain, back pain and right shoulder pain.   Integumentary: No rash, pruritus, skin lesions Neurological: as above Psychiatric: No depression at this time.  No anxiety Endocrine: No palpitations, diaphoresis, change in appetite, change in weigh or increased thirst Hematologic/Lymphatic:  No anemia, purpura, petechiae. Allergic/Immunologic: No itchy/runny eyes, nasal congestion, recent allergic reactions, rashes  ALLERGIES: Allergies  Allergen Reactions   Aspirin    Betaseron [Interferon Beta-1b]     inj site fatigue   Biaxin [Clarithromycin]    Codeine    Copaxone [Glatiramer Acetate]     Passed out, incont of bladder/bowel   Effexor [Venlafaxine]    Latex    Penicillins    Sulfa Antibiotics Itching   Topamax [Topiramate]    Zoloft [Sertraline]     HOME MEDICATIONS:  Current Outpatient Medications:    alendronate (FOSAMAX) 70 MG tablet, Take 70 mg by mouth once a week. Take with a full glass of water on an empty stomach., Disp: , Rfl:    B Complex Vitamins (B COMPLEX PO), Take 1 Dose by mouth daily., Disp: , Rfl:    Cholecalciferol (VITAMIN D3 PO), Take 2,000 Units by mouth daily., Disp: , Rfl:    Fingolimod HCl (GILENYA) 0.5 MG CAPS, Take 1 capsule (0.5 mg total) by mouth daily., Disp: 90 capsule, Rfl: 3   Ginkgo Biloba 120 MG TABS, Take 120 mg by mouth daily., Disp: , Rfl:    Multiple Vitamin (MULTIVITAMIN ADULT  PO), Take 1 tablet by mouth daily., Disp: , Rfl:    Multiple Vitamins-Minerals (ZINC PO), Take 50 mg by mouth daily., Disp: , Rfl:    Omega-3 Fatty Acids (FISH OIL PO), Take 1,400 mg by mouth daily., Disp: , Rfl:    Probiotic Product (PROBIOTIC PO), Take 1 Dose by mouth daily., Disp: , Rfl:    simvastatin (ZOCOR) 20 MG tablet, Take 1 tablet (20 mg total) by mouth daily., Disp: 90 tablet, Rfl: 0  PAST MEDICAL HISTORY: Past Medical History:  Diagnosis Date   Breast cancer ([place]) 2008   Depression    High cholesterol    Multiple sclerosis ([place])     PAST SURGICAL HISTORY:   FAMILY HISTORY: Family History  Problem Relation Age of Onset   Asthma Mother    Heart attack Father  SOCIAL HISTORY:  Social History   Socioeconomic History   Marital status: Widowed    Spouse name: Not on file   Number of children: 0   Years of education: Some college   Highest education level: Not on file  Occupational History   Occupation: Retired  Tobacco Use   Smoking status: Every Day    Packs/day: 1.50    Types: Cigarettes   Smokeless tobacco: Never  Substance and Sexual Activity   Alcohol use: Yes    Comment: 2 drinks per week   Drug use: Never   Sexual activity: Not on file  Other Topics Concern   Not on file  Social History Narrative   Right handed   No caffeine use   Lives alone   Social Determinants of Health   Financial Resource Strain: Not on file  Food Insecurity: Not on file  Transportation Needs: Not on file  Physical Activity: Not on file  Stress: Not on file  Social Connections: Not on file  Intimate Partner Violence: Not on file     PHYSICAL EXAM  Vitals:   06/09/22 1422  BP: (!) 168/103  Pulse: (!) 102  Weight: 110 lb 8 oz (50.1 kg)  Height: 5' 4.5" (1.638 m)    Body mass index is 18.67 kg/m.  No results found.    General: The patient is well-developed and well-nourished and in no acute distress  HEENT:  Head is Falls/AT.  Sclera are anicteric.     Neck: No carotid bruits are noted.  The neck is non-tender with normal educed range of motion..  Skin: Extremities are without rash or  edema.  Musculoskeletal:  Shoulder now with normal ROM and is non-tender.    The left CMC joint at the base of the thumb is painful and tender.   Slightly swollen, No erythema, symmetric temperature.       Neurologic Exam  Mental status: The patient is alert and oriented x 3 at the time of the examination. The patient has apparent normal recent and remote memory, with an apparently normal attention span and concentration ability.   Speech is normal.  Cranial nerves: Extraocular movements are full. Facial strength and sensation are normal.   No obvious hearing deficits are noted.  Motor:  Muscle bulk is normal.   Tone is normal. Strength is  5 / 5 in all 4 extremities.   Sensory: Sensory testing is intact to pinprick, soft touch and vibration sensation in all 4 extremities.  Coordination: Cerebellar testing reveals good finger-nose-finger and heel-to-shin bilaterally.  Gait and station: Station is normal.   Gait is slightly wide. Tandem gait is  very poor.  Romberg is negative.   Reflexes: Deep tendon reflexes are symmetric and normal bilaterally in arms and increased at knees.  No ankle clonus.         ASSESSMENT AND PLAN  Pain of left thumb - Plan: Rheumatoid factor, ANA w/Reflex, Sedimentation rate, Uric Acid, DG Hand 2 View Left, CANCELED: DG Hand 2 View Left  Arthralgia, unspecified joint - Plan: Rheumatoid factor, ANA w/Reflex, Sedimentation rate, Uric Acid  Multiple sclerosis (H[facility])  Gait disturbance - Plan: CBC with Differential/Platelet, Hepatic function panel  High risk medication use - Plan: CBC with Differential/Platelet, Hepatic function panel  Her MS appears stable.  Continue Gilenya.   Check labs Inject the left carpal metacarpal joint at the base of the thumb with 1 mg dexamethasone and 1 cc Marcaine using sterile technique.   She tolerated the  procedure well.  There were no complications pain was just slightly better afterwards.   Check labs for inflammatory arthritis.  I ordered an x-ray of the left hand to better evaluate the degenerative changes.  She does not know her way around [place] and would like to do this at [place] 6 months, sooner if new or worsening issues.    Phoenyx Melka A. Felecia Shelling, MD, Gifford Shave 06/06/121, 2:41 PM Certified in Neurology, Clinical Neurophysiology, Sleep Medicine and Neuroimaging  [facility] [address] [place], [place] [address] [phone]

## 2022-06-10 LAB — CBC WITH DIFFERENTIAL/PLATELET
Basophils Absolute: 0 10*3/uL (ref 0.0–0.2)
Basos: 0 %
EOS (ABSOLUTE): 0 10*3/uL (ref 0.0–0.4)
Eos: 0 %
Hematocrit: 43.4 % (ref 34.0–46.6)
Hemoglobin: 15.3 g/dL (ref 11.1–15.9)
Immature Grans (Abs): 0 10*3/uL (ref 0.0–0.1)
Immature Granulocytes: 1 %
Lymphocytes Absolute: 0.3 10*3/uL — ABNORMAL LOW (ref 0.7–3.1)
Lymphs: 5 %
MCH: 35 pg — ABNORMAL HIGH (ref 26.6–33.0)
MCHC: 35.3 g/dL (ref 31.5–35.7)
MCV: 99 fL — ABNORMAL HIGH (ref 79–97)
Monocytes Absolute: 0.6 10*3/uL (ref 0.1–0.9)
Monocytes: 11 %
Neutrophils Absolute: 4.5 10*3/uL (ref 1.4–7.0)
Neutrophils: 83 %
Platelets: 241 10*3/uL (ref 150–450)
RBC: 4.37 x10E6/uL (ref 3.77–5.28)
RDW: 12.3 % (ref 11.7–15.4)
WBC: 5.5 10*3/uL (ref 3.4–10.8)

## 2022-06-10 LAB — SEDIMENTATION RATE: Sed Rate: 8 mm/hr (ref 0–40)

## 2022-06-10 LAB — HEPATIC FUNCTION PANEL
ALT: 45 IU/L — ABNORMAL HIGH (ref 0–32)
AST: 54 IU/L — ABNORMAL HIGH (ref 0–40)
Albumin: 4.9 g/dL — ABNORMAL HIGH (ref 3.8–4.8)
Alkaline Phosphatase: 103 IU/L (ref 44–121)
Bilirubin Total: 0.5 mg/dL (ref 0.0–1.2)
Bilirubin, Direct: 0.18 mg/dL (ref 0.00–0.40)
Total Protein: 7.3 g/dL (ref 6.0–8.5)

## 2022-06-10 LAB — URIC ACID: Uric Acid: 3.4 mg/dL (ref 3.0–7.2)

## 2022-06-10 LAB — ANA W/REFLEX: Anti Nuclear Antibody (ANA): NEGATIVE

## 2022-06-10 LAB — RHEUMATOID FACTOR: Rheumatoid fact SerPl-aCnc: <10 IU/mL (ref <14.0)

## 2022-06-15 ENCOUNTER — Telehealth: Payer: Self-pay | Admitting: Neurology

## 2022-06-15 DIAGNOSIS — G8929 Other chronic pain: Secondary | ICD-10-CM

## 2022-06-15 DIAGNOSIS — M2550 Pain in unspecified joint: Secondary | ICD-10-CM

## 2022-06-15 NOTE — Telephone Encounter (Addendum)
Will ask if this request should be completed now or after X-rays are completed. It may beneficial to wait until x-rays are completed but will double check with MD..  Checked with MD and he was agreeable to entering referral. I have entered.  He also said if she wanted to wait until evaluated by the hand specialists to complete the x-rays that is fine. The specialist may want to do these orders or additional testing at time of the assessment.   Order have signed if pt wants to move forward with imaging as order by Dr. Felecia Shelling.

## 2022-06-15 NOTE — Telephone Encounter (Signed)
Patient called asking about the hand xray Dr. Felecia Shelling ordered for her. These do not go into my workqueue but I printed the order to get signed and I will send it to [facility]. She wanted to report that the injections Dr. Felecia Shelling gave her did not help and she is wondering if she can get a referral to a hand specialist in [facility]?

## 2022-06-15 NOTE — Addendum Note (Signed)
Addended by: Verlin Grills on: 06/15/2022 04:43 PM   Modules accepted: Orders

## 2022-06-15 NOTE — Telephone Encounter (Signed)
I sent the order for xray to [facility]

## 2022-06-16 NOTE — Telephone Encounter (Signed)
I called the pt and relayed Dr. Garth Bigness message Sater, Nanine Means, MD  Anda Latina, RN Caller: Unspecified (Yesterday,  4:14 PM) Since she is continuing to experience pain in the hand it is reasonable to see a hand orthopedic surgeon.  We can just go ahead and have her get the hand x-ray first at [facility] and then they can give additional imaging if needed.   Pt verbalized understanding of recommendation and will proceed with hand specialist referral this time. She would like to forgo x-rays as previously ordered by Dr. Felecia Shelling.

## 2022-10-20 ENCOUNTER — Telehealth: Payer: Self-pay | Admitting: *Deleted

## 2022-10-20 NOTE — Telephone Encounter (Signed)
Called and spoke with pt. Advised we received a fax from [employer] that her patient assistance for Gilenya will expire on 12/18/22 and she will need to reapply. She has not received form yet but states she is familiar with this process and will turn in completed form with finanical info to [place] once complete so we can complete our portion at that point and then turn it it. She verbalized understanding and appreciation.

## 2022-10-26 ENCOUNTER — Telehealth: Payer: Self-pay | Admitting: Neurology

## 2022-10-26 NOTE — Telephone Encounter (Signed)
Pt is requesting a refill for Fingolimod HCl (GILENYA) 0.5 MG CAPS.  Pharmacy: [employer] [phone]

## 2022-10-26 NOTE — Telephone Encounter (Signed)
Faxed completed/signed form back to [employer] at [phone]. Received fax confirmation.

## 2022-10-26 NOTE — Telephone Encounter (Signed)
Form filled out for refill/Gilenya, waiting on MD to sign and then will fax to [employer]

## 2022-11-14 ENCOUNTER — Telehealth: Payer: Self-pay | Admitting: Neurology

## 2022-11-17 NOTE — Telephone Encounter (Signed)
Called pt back. Advised we faxed form and got confirmation back on 10/26/22. [employer] stating they did not receive. Aware I will resend. She will f/u with them tomorrow.  I refaxed twice to fax# below. Received fax confirmation.

## 2022-11-17 NOTE — Telephone Encounter (Signed)
Pt called, [employer] said faxed twice and no one has responded to their faxes for Gilenya.   Informed pt faxed completed form on 10/26/22 and fax number.  Pt said will call [employer] back.

## 2022-11-23 NOTE — Telephone Encounter (Signed)
Faxed prescriber/pt portion of pt assistance forms to [employer] at [phone]. Received fax confirmation.

## 2023-01-02 NOTE — Telephone Encounter (Signed)
Received fax from [employer] that patient approved for assistance for Gilenya until 12/19/23. Patient ID: [id number].

## 2023-03-14 ENCOUNTER — Encounter: Payer: Self-pay | Admitting: Neurology

## 2023-03-14 ENCOUNTER — Ambulatory Visit (INDEPENDENT_AMBULATORY_CARE_PROVIDER_SITE_OTHER): Payer: Medicare Other | Admitting: Neurology

## 2023-03-14 VITALS — BP 157/90 | HR 89 | Ht 64.5 in | Wt 114.8 lb

## 2023-03-14 DIAGNOSIS — M79645 Pain in left finger(s): Secondary | ICD-10-CM

## 2023-03-14 DIAGNOSIS — G35 Multiple sclerosis: Secondary | ICD-10-CM

## 2023-03-14 DIAGNOSIS — M25511 Pain in right shoulder: Secondary | ICD-10-CM

## 2023-03-14 DIAGNOSIS — Z79899 Other long term (current) drug therapy: Secondary | ICD-10-CM

## 2023-03-14 DIAGNOSIS — R269 Unspecified abnormalities of gait and mobility: Secondary | ICD-10-CM | POA: Diagnosis not present

## 2023-03-14 DIAGNOSIS — G8929 Other chronic pain: Secondary | ICD-10-CM

## 2023-03-14 NOTE — Progress Notes (Addendum)
[facility]  PATIENT: Cynthia Hall DOB: [birth year]  REFERRING DOCTOR OR PCP: Otto Herb  MD [facility] neurology) SOURCE: Patient, notes from [facility] neurology, imaging and lab reports, MRI images personally reviewed.  _________________________________   HISTORICAL  CHIEF COMPLAINT:  Chief Complaint  Patient presents with   Follow-up    RM 11, alone. Last seen 06/09/22. Taking Gilenya. Tolerating ok. Having worsening bowel function. Has seen GI in the past. Has been about a yr since last appt. Feels vision is worse, wears glasses. Has next eye appt in a few months. Goes yearly.     HISTORY OF PRESENT ILLNESS:  Cynthia Hall, is a 67 y.o. woman with multiple sclerosis.  Update 03/14/2023: She is on Gilenya and tolerates it well.  She had possible exacerbation with reduced walking last year lasting one week but unclear since she also was not fully good at that time..  She felt one leg was weaker at the time.  She feels her gait is doing about the same with reduced balance and mild leg weakness.  She has had some stumbles and rare falls.  She also reports mild right arm and shoulder weakness.     She has no numbness in arms but she has dysesthesias in her feet.  She has urinary urgency and occasional urge incontinence.   She notes reduced visual acuity, right worse then left.  Even with glasses she is not 20/20.  She notes normal color vision.     She notes no major issues with fatigue.    She sleeps well most nights.   She has noted mild cognitive issues - especially coming up with the right words.   She has had depression but feels mood is currently fine.    She has pain off and on at the base of the left thumb.   A joint injection helped and it still helps..    At our initial visit she had had pain in the right shoulder.  A subacromial bursa injection quickly benefited her and she continues to report that range of motion in the shoulder is good and pain is  minimal now.   ROM improved    MS History: She was diagnosed with MS in 1995 after presenting with right arm numbness and twitching in the arm.  MRI of the brain and spine was consistent with MS.   She felt she did not have MS so did not start any medication.   In 2011 she had more symptoms and had an MRI performed which showed changes c/w MS.   She was initially started on an interferon.   She switched to Copaxone but did not tolerate it well.   She switched to [place] in 2013 and has continued on it.  She feels her MS has been stable and she has no definite exacerbation.  She had left breast cancer and had bilateral mastectomy in 2008.   She did not need chemotherapy.    She has high cholesterol.   She does not have a PCP.     Imaging review: MRI of the brain 07/14/2010 shows multiple T2/FLAIR hyperintense foci predominantly in the periventricular white matter, radially oriented to the ventricles.  One focus is also noted in the right middle cerebellar peduncle adjacent to the ventricle.  None of the foci enhance after contrast was administered.  MRI of the cervical spine 07/14/2010 shows T2 hyperintense foci predominantly in the upper cervical spine.  Foci noted towards the right adjacent to  C3 to C4C5 and centrally adjacent to C5.   Spinal stenosis (mild ) at [facility] and C6C7.   MRI cervical spine 01/19/2021 showed T2 hyperintense foci adjacent to C3-C4, C4-C5 and C5-C6 as detailed above.  None of these appear to be acute.   They were all present on the MRI from 2011 and are consistent with chronic demyelinating plaque associated with multiple sclerosis..   Mild to moderate spinal stenosis and foraminal narrowing at C5-C6 due to degenerative changes that have slightly progressed compared to the 2011 MRI..   Milder degenerative changes at C4-C5 and C6-C7 that are stable.  There is no spinal stenosis or nerve root compression at these levels.  MRI brain 01/19/2021 showed Extensive T2/FLAIR single and  confluent hyperintense foci predominantly in the periventricular white matter of the hemispheres.  2 small foci also noted in the medulla.  The extent of the white matter foci is essentially unchanged compared to the 2011 MRI.Marland Kitchen    Moderate generalized cortical atrophy, corpus callosum atrophy and cerebellar atrophy that has progressed compared to the 2011 MRI.    REVIEW OF SYSTEMS: Constitutional: No fevers, chills, sweats, or change in appetite.  Mild fatigue. Eyes: She notes poor vision.  No diplopia.  No eye pain.   Ear, nose and throat: No hearing loss, ear pain, nasal congestion, sore throat Cardiovascular: No chest pain, palpitations Respiratory:  No shortness of breath at rest or with exertion.   No wheezes GastrointestinaI: No nausea, vomiting, diarrhea, abdominal pain, fecal incontinence Genitourinary:  No dysuria, urinary retention or frequency.  No nocturia. Musculoskeletal: Neck pain, back pain and right shoulder pain.   Integumentary: No rash, pruritus, skin lesions Neurological: as above Psychiatric: No depression at this time.  No anxiety Endocrine: No palpitations, diaphoresis, change in appetite, change in weigh or increased thirst Hematologic/Lymphatic:  No anemia, purpura, petechiae. Allergic/Immunologic: No itchy/runny eyes, nasal congestion, recent allergic reactions, rashes  ALLERGIES: Allergies  Allergen Reactions   Aspirin    Betaseron [Interferon Beta-1b]     inj site fatigue   Biaxin [Clarithromycin]    Codeine    Copaxone [Glatiramer Acetate]     Passed out, incont of bladder/bowel   Effexor [Venlafaxine]    Latex    Penicillins    Sulfa Antibiotics Itching   Topamax [Topiramate]    Zoloft [Sertraline]     HOME MEDICATIONS:  Current Outpatient Medications:    alendronate (FOSAMAX) 70 MG tablet, Take 70 mg by mouth once a week. Take with a full glass of water on an empty stomach., Disp: , Rfl:    B Complex Vitamins (B COMPLEX PO), Take 1 Dose by  mouth daily., Disp: , Rfl:    Cholecalciferol (VITAMIN D3 PO), Take 2,000 Units by mouth daily., Disp: , Rfl:    Fingolimod HCl (GILENYA) 0.5 MG CAPS, Take 1 capsule (0.5 mg total) by mouth daily., Disp: 90 capsule, Rfl: 3   Ginkgo Biloba 120 MG TABS, Take 120 mg by mouth daily., Disp: , Rfl:    Multiple Vitamin (MULTIVITAMIN ADULT PO), Take 1 tablet by mouth daily., Disp: , Rfl:    Multiple Vitamins-Minerals (ZINC PO), Take 50 mg by mouth daily., Disp: , Rfl:    Omega-3 Fatty Acids (FISH OIL PO), Take 1,400 mg by mouth daily., Disp: , Rfl:    Probiotic Product (PROBIOTIC PO), Take 1 Dose by mouth daily., Disp: , Rfl:    simvastatin (ZOCOR) 20 MG tablet, Take 1 tablet (20 mg total) by mouth daily., Disp: 90  tablet, Rfl: 0  PAST MEDICAL HISTORY: Past Medical History:  Diagnosis Date   Breast cancer ([place]) 2008   Depression    High cholesterol    Multiple sclerosis ([place])     PAST SURGICAL HISTORY:   FAMILY HISTORY: Family History  Problem Relation Age of Onset   Asthma Mother    Heart attack Father     SOCIAL HISTORY:  Social History   Socioeconomic History   Marital status: Widowed    Spouse name: Not on file   Number of children: 0   Years of education: Some college   Highest education level: Not on file  Occupational History   Occupation: Retired  Tobacco Use   Smoking status: Every Day    Packs/day: 1.5    Types: Cigarettes   Smokeless tobacco: Never  Substance and Sexual Activity   Alcohol use: Yes    Comment: 2 drinks per week   Drug use: Never   Sexual activity: Not on file  Other Topics Concern   Not on file  Social History Narrative   Right handed   No caffeine use   Lives alone   Social Determinants of Health   Financial Resource Strain: Not on file  Food Insecurity: Not on file  Transportation Needs: Not on file  Physical Activity: Not on file  Stress: Not on file  Social Connections: Not on file  Intimate Partner Violence: Not on file      PHYSICAL EXAM  Vitals:   03/14/23 1534 03/14/23 1542  BP: (!) 167/87 (!) 157/90  Pulse: 89 89  Weight: 114 lb 12.8 oz (52.1 kg)   Height: 5' 4.5" (1.638 m)     Body mass index is 19.4 kg/m.  No results found.    General: The patient is well-developed and well-nourished and in no acute distress  HEENT:  Head is [place]/AT.  Sclera are anicteric.    Neck: No carotid bruits are noted.  The neck is non-tender with normal educed range of motion..  Skin: Extremities are without rash or  edema.  Musculoskeletal:  Shoulder now with normal ROM and is non-tender.    The left CMC joint at the base of the thumb is mildly tender but much improved compared to last visit.  No erythema, symmetric temperature.       Neurologic Exam  Mental status: The patient is alert and oriented x 3 at the time of the examination. The patient has apparent normal recent and remote memory, with an apparently normal attention span and concentration ability.   Speech is normal.  Cranial nerves: Extraocular movements are full. Facial strength and sensation are normal.   No obvious hearing deficits are noted.  Motor:  Muscle bulk is normal.   Tone is normal. Strength is  5 / 5 in all 4 extremities.   Sensory: Sensory testing is intact to pinprick, soft touch and vibration sensation in all 4 extremities.  Coordination: Cerebellar testing reveals good finger-nose-finger and heel-to-shin bilaterally.  Gait and station: Station is normal.   Gait is slightly wide. Tandem gait is very poor.  Romberg is negative.   Reflexes: Deep tendon reflexes are symmetric and normal bilaterally in arms and increased at knees.  No ankle clonus.         ASSESSMENT AND PLAN  Multiple sclerosis ([place]) - Plan: CBC with Differential/Platelet, Hepatic function panel  High risk medication use - Plan: CBC with Differential/Platelet, Hepatic function panel  Gait disturbance  Pain of left thumb  Chronic right shoulder  pain  Her MS appears stable.  Continue Gilenya but reduce dose to every other day.   I discussed with her that the risk of infection could increase with age.  Additionally the risk of exacerbation/new lesion on MRI reduces with age.  Her MRI from 2022 had not shown any real progression compared to 2011 so activity is likely minimal at this point.  Check labs again (lymphocytes were 0.3 last check .   Stay active and exercise as tolerated. Rtc 6 months, sooner if new or worsening issues.    This visit is part of a comprehensive longitudinal care medical relationship regarding the patients primary diagnosis of multiple sclerosis and related concerns.  December Hedtke A. Felecia Shelling, MD, [facility] [id number], [id number] PM Certified in Neurology, Clinical Neurophysiology, Sleep Medicine and Neuroimaging  [facility] [address] [place], [place] [address] [phone]

## 2023-03-14 NOTE — Addendum Note (Signed)
Addended by: Britt Bottom on: 03/14/2023 04:47 PM   Modules accepted: Level of Service

## 2023-03-15 LAB — CBC WITH DIFFERENTIAL/PLATELET
Basophils Absolute: 0 10*3/uL (ref 0.0–0.2)
Basos: 0 %
EOS (ABSOLUTE): 0 10*3/uL (ref 0.0–0.4)
Eos: 0 %
Hematocrit: 45.8 % (ref 34.0–46.6)
Hemoglobin: 15.7 g/dL (ref 11.1–15.9)
Immature Grans (Abs): 0 10*3/uL (ref 0.0–0.1)
Immature Granulocytes: 0 %
Lymphocytes Absolute: 0.4 10*3/uL — ABNORMAL LOW (ref 0.7–3.1)
Lymphs: 5 %
MCH: 34.8 pg — ABNORMAL HIGH (ref 26.6–33.0)
MCHC: 34.3 g/dL (ref 31.5–35.7)
MCV: 102 fL — ABNORMAL HIGH (ref 79–97)
Monocytes Absolute: 0.8 10*3/uL (ref 0.1–0.9)
Monocytes: 12 %
Neutrophils Absolute: 5.6 10*3/uL (ref 1.4–7.0)
Neutrophils: 83 %
Platelets: 295 10*3/uL (ref 150–450)
RBC: 4.51 x10E6/uL (ref 3.77–5.28)
RDW: 11.8 % (ref 11.7–15.4)
WBC: 6.9 10*3/uL (ref 3.4–10.8)

## 2023-03-15 LAB — HEPATIC FUNCTION PANEL
ALT: 29 IU/L (ref 0–32)
AST: 38 IU/L (ref 0–40)
Albumin: 4.7 g/dL (ref 3.9–4.9)
Alkaline Phosphatase: 84 IU/L (ref 44–121)
Bilirubin Total: 0.3 mg/dL (ref 0.0–1.2)
Bilirubin, Direct: 0.14 mg/dL (ref 0.00–0.40)
Total Protein: 7.2 g/dL (ref 6.0–8.5)

## 2023-08-24 ENCOUNTER — Telehealth: Payer: Self-pay | Admitting: *Deleted

## 2023-08-24 NOTE — Telephone Encounter (Signed)
Noted  

## 2023-08-24 NOTE — Telephone Encounter (Signed)
Received notice [employer]  that they will no longer continue to support pt for Gilenya.  This will end 12-19-2023.  [phone].   Pt has her next appt 11-23-2023.   This takes effect 09-19-2023.  They will follow pt until end of her enrollment.

## 2023-11-14 ENCOUNTER — Ambulatory Visit: Payer: Medicare Other | Admitting: Neurology

## 2023-11-23 ENCOUNTER — Encounter: Payer: Self-pay | Admitting: Neurology

## 2023-11-23 ENCOUNTER — Ambulatory Visit (INDEPENDENT_AMBULATORY_CARE_PROVIDER_SITE_OTHER): Payer: Medicare Other | Admitting: Neurology

## 2023-11-23 VITALS — BP 150/86 | HR 84 | Ht 64.5 in | Wt 118.0 lb

## 2023-11-23 DIAGNOSIS — E785 Hyperlipidemia, unspecified: Secondary | ICD-10-CM

## 2023-11-23 DIAGNOSIS — M25511 Pain in right shoulder: Secondary | ICD-10-CM | POA: Diagnosis not present

## 2023-11-23 DIAGNOSIS — G8929 Other chronic pain: Secondary | ICD-10-CM

## 2023-11-23 DIAGNOSIS — Z79899 Other long term (current) drug therapy: Secondary | ICD-10-CM

## 2023-11-23 DIAGNOSIS — R269 Unspecified abnormalities of gait and mobility: Secondary | ICD-10-CM

## 2023-11-23 DIAGNOSIS — G35 Multiple sclerosis: Secondary | ICD-10-CM

## 2023-11-23 NOTE — Progress Notes (Signed)
[facility]  PATIENT: Cynthia Hall DOB: 11-Sep-1956  REFERRING DOCTOR OR PCP: Anselm Pancoast  MD [facility] neurology) SOURCE: Patient, notes from [facility] neurology, imaging and lab reports, MRI images personally reviewed.  _________________________________   HISTORICAL  CHIEF COMPLAINT:  Chief Complaint  Patient presents with   Follow-up    Pt in room 11 alone. Here for MS follow up. Patient reports doing okay.     HISTORY OF PRESENT ILLNESS:  Cynthia Hall, is a 67 y.o. woman with multiple sclerosis.  Update 11/23/2023: She is every other day Gilenya and tolerates it well.  We cut the dose down due to age.  She may have had an exacerbation with leg symptoms in 2022  Gait is reduced due to reduced balance and mild leg weakness.  She fell last month hurting her hand, right knee and broke glasses.   She also reports mild right arm and shoulder weakness.     She has no numbness in arms but she has dysesthesias in her feet.    She has urinary urgency and occasional urge incontinence.     She notes reduced visual acuity, right worse then left.  Even with glasses she is about 20/40.  She notes normal color vision.     She denies much fatigue.    She sleeps well most nights.   She has 1 x nocturia.   She has noted mild cognitive issues - especially coming up with the right words.   She has had depression but feels mood is currently fine.    She denies any shoulder pain.  A subacromial bursa injection quickly benefited her and she continues to report that range of motion in the shoulder is good and pain is minimal now.   ROM improved    MS History: She was diagnosed with MS in 1995 after presenting with right arm numbness and twitching in the arm.  MRI of the brain and spine was consistent with MS.   She felt she did not have MS so did not start any medication.   In 2011 she had more symptoms and had an MRI performed which showed changes c/w MS.   She was  initially started on an interferon.   She switched to Copaxone but did not tolerate it well.   She switched to Gilenya in 2013 and has continued on it.  She feels her MS has been stable and she has no definite exacerbation.  She had left breast cancer and had bilateral mastectomy in 2008.   She did not need chemotherapy.    She has high cholesterol.   She does not have a PCP.     Imaging review: MRI of the brain 07/14/2010 shows multiple T2/FLAIR hyperintense foci predominantly in the periventricular white matter, radially oriented to the ventricles.  One focus is also noted in the right middle cerebellar peduncle adjacent to the ventricle.  None of the foci enhance after contrast was administered.  MRI of the cervical spine 07/14/2010 shows T2 hyperintense foci predominantly in the upper cervical spine.  Foci noted towards the right adjacent to C3 to C4C5 and centrally adjacent to C5.   Spinal stenosis (mild ) at [facility] and C6C7.   MRI cervical spine 01/19/2021 showed T2 hyperintense foci adjacent to C3-C4, C4-C5 and C5-C6 as detailed above.  None of these appear to be acute.   They were all present on the MRI from 2011 and are consistent with chronic demyelinating plaque associated with multiple sclerosis.Marland Kitchen  Mild to moderate spinal stenosis and foraminal narrowing at C5-C6 due to degenerative changes that have slightly progressed compared to the 2011 MRI..   Milder degenerative changes at C4-C5 and C6-C7 that are stable.  There is no spinal stenosis or nerve root compression at these levels.  MRI brain 01/19/2021 showed Extensive T2/FLAIR single and confluent hyperintense foci predominantly in the periventricular white matter of the hemispheres.  2 small foci also noted in the medulla.  The extent of the white matter foci is essentially unchanged compared to the 2011 MRI.Marland Kitchen    Moderate generalized cortical atrophy, corpus callosum atrophy and cerebellar atrophy that has progressed compared to the 2011  MRI.    REVIEW OF SYSTEMS: Constitutional: No fevers, chills, sweats, or change in appetite.  Mild fatigue. Eyes: She notes poor vision.  No diplopia.  No eye pain.   Ear, nose and throat: No hearing loss, ear pain, nasal congestion, sore throat Cardiovascular: No chest pain, palpitations Respiratory:  No shortness of breath at rest or with exertion.   No wheezes GastrointestinaI: No nausea, vomiting, diarrhea, abdominal pain, fecal incontinence Genitourinary:  No dysuria, urinary retention or frequency.  No nocturia. Musculoskeletal: Neck pain, back pain and right shoulder pain.   Integumentary: No rash, pruritus, skin lesions Neurological: as above Psychiatric: No depression at this time.  No anxiety Endocrine: No palpitations, diaphoresis, change in appetite, change in weigh or increased thirst Hematologic/Lymphatic:  No anemia, purpura, petechiae. Allergic/Immunologic: No itchy/runny eyes, nasal congestion, recent allergic reactions, rashes  ALLERGIES: Allergies  Allergen Reactions   Aspirin    Betaseron [Interferon Beta-1b]     inj site fatigue   Biaxin [Clarithromycin]    Codeine    Copaxone [Glatiramer Acetate]     Passed out, incont of bladder/bowel   Effexor [Venlafaxine]    Latex    Penicillins    Sulfa Antibiotics Itching   Topamax [Topiramate]    Zoloft [Sertraline]     HOME MEDICATIONS:  Current Outpatient Medications:    alendronate (FOSAMAX) 70 MG tablet, Take 70 mg by mouth once a week. Take with a full glass of water on an empty stomach., Disp: , Rfl:    B Complex Vitamins (B COMPLEX PO), Take 1 Dose by mouth daily., Disp: , Rfl:    Cholecalciferol (VITAMIN D3 PO), Take 2,000 Units by mouth daily., Disp: , Rfl:    Fingolimod HCl (GILENYA) 0.5 MG CAPS, Take 1 capsule (0.5 mg total) by mouth daily. (Patient taking differently: Take 1 capsule by mouth daily. Take one tablet every other day), Disp: 90 capsule, Rfl: 3   Ginkgo Biloba 120 MG TABS, Take 120 mg  by mouth daily., Disp: , Rfl:    hydrochlorothiazide (HYDRODIURIL) 25 MG tablet, Take by mouth daily., Disp: , Rfl:    Multiple Vitamin (MULTIVITAMIN ADULT PO), Take 1 tablet by mouth daily., Disp: , Rfl:    Multiple Vitamins-Minerals (ZINC PO), Take 50 mg by mouth daily., Disp: , Rfl:    Omega-3 Fatty Acids (FISH OIL PO), Take 1,400 mg by mouth daily., Disp: , Rfl:    Probiotic Product (PROBIOTIC PO), Take 1 Dose by mouth daily., Disp: , Rfl:    simvastatin (ZOCOR) 20 MG tablet, Take 1 tablet (20 mg total) by mouth daily., Disp: 90 tablet, Rfl: 0  PAST MEDICAL HISTORY: Past Medical History:  Diagnosis Date   Breast cancer ([facility]) 2008   Depression    High cholesterol    Multiple sclerosis ([facility])     PAST SURGICAL  HISTORY:   FAMILY HISTORY: Family History  Problem Relation Age of Onset   Asthma Mother    Heart attack Father     SOCIAL HISTORY:  Social History   Socioeconomic History   Marital status: Widowed    Spouse name: Not on file   Number of children: 0   Years of education: Some college   Highest education level: Not on file  Occupational History   Occupation: Retired  Tobacco Use   Smoking status: Every Day    Current packs/day: 1.50    Types: Cigarettes   Smokeless tobacco: Never  Substance and Sexual Activity   Alcohol use: Yes    Comment: 2 drinks per week   Drug use: Never   Sexual activity: Not on file  Other Topics Concern   Not on file  Social History Narrative   Right handed   No caffeine use   Lives alone   Social Determinants of Health   Financial Resource Strain: Not on file  Food Insecurity: Not on file  Transportation Needs: Not on file  Physical Activity: Not on file  Stress: Not on file  Social Connections: Not on file  Intimate Partner Violence: Not on file     PHYSICAL EXAM  Vitals:   11/23/23 1418 11/23/23 1423  BP: (!) 147/89 (!) 150/86  Pulse: 84   Weight: 118 lb (53.5 kg)   Height: 5' 4.5" (1.638 m)     Body mass  index is 19.94 kg/m.  No results found.    General: The patient is well-developed and well-nourished and in no acute distress  HEENT:  Head is /AT.  Sclera are anicteric.    Neck: No carotid bruits are noted.  The neck is non-tender with normal educed range of motion..  Skin: Extremities are without rash or  edema.  Musculoskeletal:  Shoulder now with normal ROM and is non-tender.    The left CMC joint at the base of the thumb is mildly tender but much improved compared to last visit.  No erythema, symmetric temperature.       Neurologic Exam  Mental status: The patient is alert and oriented x 3 at the time of the examination. The patient has apparent normal recent and remote memory, with an apparently normal attention span and concentration ability.   Speech is normal.  Cranial nerves: Extraocular movements are full. Facial strength and sensation are normal.   No obvious hearing deficits are noted.  Motor:  Muscle bulk is normal.   Tone is normal. Strength is  5 / 5 in all 4 extremities.   Sensory: Sensory testing is intact to pinprick, soft touch and vibration sensation in all 4 extremities.  Coordination: Cerebellar testing reveals good finger-nose-finger and heel-to-shin bilaterally.  Gait and station: Station is normal.   Gait is slightly wide. Tandem gait is very poor. Romberg is negative.    Reflexes: Deep tendon reflexes are symmetric and normal bilaterally in arms and increased at knees.  No ankle clonus.         ASSESSMENT AND PLAN  Multiple sclerosis ([facility])  High risk medication use  Gait disturbance  Chronic right shoulder pain  Hyperlipidemia, unspecified hyperlipidemia type  Her MS is stable.  Continue Gilenya every other day.  Consider recheck MRI in 2025 or 2026 and stop if no new lesions   I discussed with her that the risk of infection could increase with age so lower dose is may be safer.   Her MRI from  2022 had not shown any real progression  compared to 2011 so activity is likely minimal at this point but possible exacerbation in 2022.  Check labs again (lymphocytes were 0.4 last check ).   Stay active and exercise as tolerated. Rtc 6 months, sooner if new or worsening issues.    This visit is part of a comprehensive longitudinal care medical relationship regarding the patients primary diagnosis of multiple sclerosis and related concerns.  Cynthia Hall A. Epimenio Foot, MD, [facility] 11/23/2023, 2:48 PM Certified in Neurology, Clinical Neurophysiology, Sleep Medicine and Neuroimaging  [facility] [address], [address] [place], [place] [address] [phone]

## 2023-11-24 LAB — CBC WITH DIFFERENTIAL/PLATELET
Basophils Absolute: 0 10*3/uL (ref 0.0–0.2)
Basos: 0 %
EOS (ABSOLUTE): 0 10*3/uL (ref 0.0–0.4)
Eos: 0 %
Hematocrit: 45 % (ref 34.0–46.6)
Hemoglobin: 15.9 g/dL (ref 11.1–15.9)
Immature Grans (Abs): 0 10*3/uL (ref 0.0–0.1)
Immature Granulocytes: 0 %
Lymphocytes Absolute: 0.5 10*3/uL — ABNORMAL LOW (ref 0.7–3.1)
Lymphs: 8 %
MCH: 36.2 pg — ABNORMAL HIGH (ref 26.6–33.0)
MCHC: 35.3 g/dL (ref 31.5–35.7)
MCV: 103 fL — ABNORMAL HIGH (ref 79–97)
Monocytes Absolute: 0.9 10*3/uL (ref 0.1–0.9)
Monocytes: 13 %
Neutrophils Absolute: 5.3 10*3/uL (ref 1.4–7.0)
Neutrophils: 79 %
Platelets: 310 10*3/uL (ref 150–450)
RBC: 4.39 x10E6/uL (ref 3.77–5.28)
RDW: 12.1 % (ref 11.7–15.4)
WBC: 6.7 10*3/uL (ref 3.4–10.8)

## 2023-11-24 LAB — COMPREHENSIVE METABOLIC PANEL
ALT: 25 [IU]/L (ref 0–32)
AST: 35 [IU]/L (ref 0–40)
Albumin: 4.9 g/dL (ref 3.9–4.9)
Alkaline Phosphatase: 90 [IU]/L (ref 44–121)
BUN/Creatinine Ratio: 12 (ref 12–28)
BUN: 8 mg/dL (ref 8–27)
Bilirubin Total: 0.6 mg/dL (ref 0.0–1.2)
CO2: 26 mmol/L (ref 20–29)
Calcium: 10.1 mg/dL (ref 8.7–10.3)
Chloride: 90 mmol/L — ABNORMAL LOW (ref 96–106)
Creatinine, Ser: 0.65 mg/dL (ref 0.57–1.00)
Globulin, Total: 2.5 g/dL (ref 1.5–4.5)
Glucose: 97 mg/dL (ref 70–99)
Potassium: 3.9 mmol/L (ref 3.5–5.2)
Sodium: 133 mmol/L — ABNORMAL LOW (ref 134–144)
Total Protein: 7.4 g/dL (ref 6.0–8.5)
eGFR: 96 mL/min/{1.73_m2} (ref >59)

## 2023-11-24 LAB — LIPID PANEL
Chol/HDL Ratio: 2.1 {ratio} (ref 0.0–4.4)
Cholesterol, Total: 213 mg/dL — ABNORMAL HIGH (ref 100–199)
HDL: 103 mg/dL (ref >39)
LDL Chol Calc (NIH): 97 mg/dL (ref 0–99)
Triglycerides: 75 mg/dL (ref 0–149)
VLDL Cholesterol Cal: 13 mg/dL (ref 5–40)

## 2023-12-11 ENCOUNTER — Telehealth: Payer: Self-pay | Admitting: Neurology

## 2023-12-11 MED ORDER — FINGOLIMOD HCL 0.5 MG PO CAPS
1.0000 | ORAL_CAPSULE | ORAL | 0 refills | Status: AC
Start: 2023-12-11 — End: ?

## 2023-12-11 NOTE — Telephone Encounter (Signed)
Pt reports that the [employer] program for  Fingolimod HCl (GILENYA) 0.5 MG CAPS is ending , she states the request for  refill was made for her weeks ago for a 90 day but she has yet to receive anything.  Pt has been told that instead of the request being sent by fax that [employer] be called [phone])re: the needed 90 day Rx for  Fingolimod HCl (GILENYA) 0.5 MG CAPS before the program ends.  Pt reports she only has about a weeks worth left of this medication, please respond.

## 2023-12-11 NOTE — Telephone Encounter (Signed)
I called [employer] and the # below and spoke with Cindee Lame and gave a verbal for 90 day supply 1 po every other day #45 with 0 refill. Per recent office note will take every other day.   I called patient and informed her of this as well.

## 2024-06-05 ENCOUNTER — Telehealth: Payer: Self-pay | Admitting: Neurology

## 2024-06-05 NOTE — Telephone Encounter (Signed)
 Pt called in regards to se if she need upcoming appt . Pt was unaware why she  was coming to see MD.

## 2024-06-05 NOTE — Telephone Encounter (Addendum)
 Last seen 11/23/23 and next f/u 06/12/24. This is her 6 month f/u. Dr. Godwin Lat requested to see her back at this time. She should keep appt. I called pt and relayed. She verbalized understanding.

## 2024-06-12 ENCOUNTER — Encounter: Payer: Self-pay | Admitting: Neurology

## 2024-06-12 ENCOUNTER — Ambulatory Visit (INDEPENDENT_AMBULATORY_CARE_PROVIDER_SITE_OTHER): Payer: Medicare Other | Admitting: Neurology

## 2024-06-12 VITALS — BP 120/78 | HR 102 | Ht 64.5 in | Wt 115.5 lb

## 2024-06-12 DIAGNOSIS — Z79899 Other long term (current) drug therapy: Secondary | ICD-10-CM | POA: Diagnosis not present

## 2024-06-12 DIAGNOSIS — R269 Unspecified abnormalities of gait and mobility: Secondary | ICD-10-CM | POA: Diagnosis not present

## 2024-06-12 DIAGNOSIS — G35 Multiple sclerosis: Secondary | ICD-10-CM | POA: Diagnosis not present

## 2024-06-12 DIAGNOSIS — R3915 Urgency of urination: Secondary | ICD-10-CM | POA: Diagnosis not present

## 2024-06-12 MED ORDER — TOLTERODINE TARTRATE ER 4 MG PO CP24: 4.0000 mg | ORAL_CAPSULE | Freq: Every day | ORAL | 3 refills | Status: AC

## 2024-06-12 NOTE — Progress Notes (Signed)
 [facility]  PATIENT: Cynthia Hall DOB: 31-Jan-1956  REFERRING DOCTOR OR PCP: Norleen Lunger  MD [facility] neurology) SOURCE: Patient, notes from [facility] neurology, imaging and lab reports, MRI images personally reviewed.  _________________________________   HISTORICAL  CHIEF COMPLAINT:  Chief Complaint  Patient presents with   Follow-up    Pt in room 11. Here for MS follow up. Pt reports she not longer on  Gilenya  . Pt reports balance has worsened, reports at least 6 falls since last visit. Next exam is due in Sept.     HISTORY OF PRESENT ILLNESS:  Cynthia Hall, is a 68 y.o. woman with multiple sclerosis.  Update 06/12/2024 She discontinued the Gilenya .  She may have had an exacerbation with leg symptoms in 2022 vs progression.      Gait is poor due to reduced balance and mild leg weakness.  She falls 1-2 tines a month due to poor balance.   She feels both legs are mildly weak.   She notes numbness and dysesthesias in her feet.    She has urinary urgency and occasional urge incontinence.  This has worsened the past year (also on Lasix for edema).    She notes reduced visual acuity, right worse then left.  Even with glasses she is about 20/40.  She notes normal color vision.     She does not note much fatigue.    She sleeps well most nights.   She has 1-2 x nocturia.   She has noted mild cognitive issues - especially coming up with the right words.   She has had depression but feels mood is currently fine.    She denies any shoulder pain.  A subacromial bursa injection quickly benefited her and she continues to report that range of motion in the shoulder is good and pain is minimal now.   ROM improved    MS History: She was diagnosed with MS in 1995 after presenting with right arm numbness and twitching in the arm.  MRI of the brain and spine was consistent with MS.   She felt she did not have MS so did not start any medication.   In 2011 she had more  symptoms and had an MRI performed which showed changes c/w MS.   She was initially started on an interferon.   She switched to Copaxone but did not tolerate it well.   She switched to Gilenya  in 2013 and has continued on it.  She feels her MS has been stable and she has no definite exacerbation.  She had left breast cancer and had bilateral mastectomy in 2008.   She did not need chemotherapy.    She has high cholesterol.   She does not have a PCP.     Imaging review: MRI of the brain 07/14/2010 shows multiple T2/FLAIR hyperintense foci predominantly in the periventricular white matter, radially oriented to the ventricles.  One focus is also noted in the right middle cerebellar peduncle adjacent to the ventricle.  None of the foci enhance after contrast was administered.  MRI of the cervical spine 07/14/2010 shows T2 hyperintense foci predominantly in the upper cervical spine.  Foci noted towards the right adjacent to C3 to C4C5 and centrally adjacent to C5.   Spinal stenosis (mild ) at [facility] and C6C7.   MRI cervical spine 01/19/2021 showed T2 hyperintense foci adjacent to C3-C4, C4-C5 and C5-C6 as detailed above.  None of these appear to be acute.   They were all present  on the MRI from 2011 and are consistent with chronic demyelinating plaque associated with multiple sclerosis..   Mild to moderate spinal stenosis and foraminal narrowing at C5-C6 due to degenerative changes that have slightly progressed compared to the 2011 MRI..   Milder degenerative changes at C4-C5 and C6-C7 that are stable.  There is no spinal stenosis or nerve root compression at these levels.  MRI brain 01/19/2021 showed Extensive T2/FLAIR single and confluent hyperintense foci predominantly in the periventricular white matter of the hemispheres.  2 small foci also noted in the medulla.  The extent of the white matter foci is essentially unchanged compared to the 2011 MRI.SABRA    Moderate generalized cortical atrophy, corpus callosum atrophy  and cerebellar atrophy that has progressed compared to the 2011 MRI.    REVIEW OF SYSTEMS: Constitutional: No fevers, chills, sweats, or change in appetite.  Mild fatigue. Eyes: She notes poor vision.  No diplopia.  No eye pain.   Ear, nose and throat: No hearing loss, ear pain, nasal congestion, sore throat Cardiovascular: No chest pain, palpitations Respiratory:  No shortness of breath at rest or with exertion.   No wheezes GastrointestinaI: No nausea, vomiting, diarrhea, abdominal pain, fecal incontinence Genitourinary:  No dysuria, urinary retention or frequency.  No nocturia. Musculoskeletal: Neck pain, back pain and right shoulder pain.   Integumentary: No rash, pruritus, skin lesions Neurological: as above Psychiatric: No depression at this time.  No anxiety Endocrine: No palpitations, diaphoresis, change in appetite, change in weigh or increased thirst Hematologic/Lymphatic:  No anemia, purpura, petechiae. Allergic/Immunologic: No itchy/runny eyes, nasal congestion, recent allergic reactions, rashes  ALLERGIES: Allergies  Allergen Reactions   Aspirin    Betaseron [Interferon Beta-1b]     inj site fatigue   Biaxin [Clarithromycin]    Codeine    Copaxone [Glatiramer Acetate]     Passed out, incont of bladder/bowel   Effexor [Venlafaxine]    Latex    Penicillins    Sulfa Antibiotics Itching   Topamax [Topiramate]    Zoloft [Sertraline]     HOME MEDICATIONS:  Current Outpatient Medications:    alendronate (FOSAMAX) 70 MG tablet, Take 70 mg by mouth once a week. Take with a full glass of water on an empty stomach., Disp: , Rfl:    B Complex Vitamins (B COMPLEX PO), Take 1 Dose by mouth daily., Disp: , Rfl:    Cholecalciferol (VITAMIN D3 PO), Take 2,000 Units by mouth daily., Disp: , Rfl:    furosemide (LASIX) 20 MG tablet, Take 20 mg by mouth daily., Disp: , Rfl:    tolterodine (DETROL LA) 4 MG 24 hr capsule, Take 1 capsule (4 mg total) by mouth daily., Disp: 90  capsule, Rfl: 3   Fingolimod  HCl (GILENYA ) 0.5 MG CAPS, Take 1 capsule (0.5 mg total) by mouth every other day. (Patient not taking: Reported on 06/12/2024), Disp: 45 capsule, Rfl: 0   Ginkgo Biloba 120 MG TABS, Take 120 mg by mouth daily. (Patient not taking: Reported on 06/12/2024), Disp: , Rfl:    hydrochlorothiazide (HYDRODIURIL) 25 MG tablet, Take by mouth daily. (Patient not taking: Reported on 06/12/2024), Disp: , Rfl:    Multiple Vitamin (MULTIVITAMIN ADULT PO), Take 1 tablet by mouth daily. (Patient not taking: Reported on 06/12/2024), Disp: , Rfl:    Multiple Vitamins-Minerals (ZINC PO), Take 50 mg by mouth daily. (Patient not taking: Reported on 06/12/2024), Disp: , Rfl:    Omega-3 Fatty Acids (FISH OIL PO), Take 1,400 mg by mouth daily. (Patient  not taking: Reported on 06/12/2024), Disp: , Rfl:    Probiotic Product (PROBIOTIC PO), Take 1 Dose by mouth daily., Disp: , Rfl:   PAST MEDICAL HISTORY: Past Medical History:  Diagnosis Date   Breast cancer (HCC) 2008   Depression    High cholesterol    Multiple sclerosis (HCC)     PAST SURGICAL HISTORY:   FAMILY HISTORY: Family History  Problem Relation Age of Onset   Asthma Mother    Heart attack Father     SOCIAL HISTORY:  Social History   Socioeconomic History   Marital status: Widowed    Spouse name: Not on file   Number of children: 0   Years of education: Some college   Highest education level: Not on file  Occupational History   Occupation: Retired  Tobacco Use   Smoking status: Every Day    Current packs/day: 1.50    Types: Cigarettes   Smokeless tobacco: Never  Vaping Use   Vaping status: Never Used  Substance and Sexual Activity   Alcohol use: Yes    Comment: glass of wine daily   Drug use: Never   Sexual activity: Not on file  Other Topics Concern   Not on file  Social History Narrative   Right handed   No caffeine use   Lives alone   Social Drivers of Corporate investment banker Strain: Not on  file  Food Insecurity: Not on file  Transportation Needs: Not on file  Physical Activity: Not on file  Stress: Not on file  Social Connections: Not on file  Intimate Partner Violence: Not on file     PHYSICAL EXAM  Vitals:   06/12/24 1406  BP: 120/78  Pulse: (!) 102  SpO2: 96%  Weight: 115 lb 8 oz (52.4 kg)  Height: 5' 4.5 (1.638 m)    Body mass index is 19.52 kg/m.  No results found.    General: The patient is well-developed and well-nourished and in no acute distress  Has mild pedal edema  HEENT:  Head is [place]/AT.  Sclera are anicteric.    Neck: No carotid bruits are noted.  The neck is non-tender with normal educed range of motion..  Skin: Extremities without rash  Musculoskeletal:  Shoulder now with normal ROM and is non-tender.    The left CMC joint at the base of the thumb is mildly tender but much improved compared to last visit.  No erythema, symmetric temperature.       Neurologic Exam  Mental status: The patient is alert and oriented x 3 at the time of the examination. The patient has apparent normal recent and remote memory, with an apparently normal attention span and concentration ability.   Speech is normal.  Cranial nerves: Extraocular movements are full. Facial strength and sensation are normal.   No obvious hearing deficits are noted.  Motor:  Muscle bulk is normal.   Tone is normal. Strength is  5 / 5 in all 4 extremities.   Sensory: Sensory testing is intact to pinprick, soft touch and vibration sensation in all 4 extremities.  Coordination: Cerebellar testing reveals good finger-nose-finger and heel-to-shin bilaterally.  Gait and station: Station is normal.   Her gait is wide and takes 4 steps to turn 180 degrees.   Cannot tandem. Romberg is negative.    Reflexes: Deep tendon reflexes are symmetric and normal bilaterally in arms and increased at knees.  No ankle clonus.         ASSESSMENT  AND PLAN  Multiple sclerosis (HCC)  High risk  medication use  Gait disturbance  Remain off Gilenya , consider MRI later in year Stay active and exercise as tolerated. Detrol LA for urinary urgency/frequency Rtc 6 months, sooner if new or worsening issues.     If she moves to [facility], she may change neurologists  This visit is part of a comprehensive longitudinal care medical relationship regarding the patients primary diagnosis of multiple sclerosis and related concerns.  Halli Equihua A. Vear, MD, [facility] 06/12/2024, 2:41 PM Certified in Neurology, Clinical Neurophysiology, Sleep Medicine and Neuroimaging  [facility] [address], [address] [place], [place] [address] [phone]

## 2025-01-14 ENCOUNTER — Ambulatory Visit: Admitting: Family Medicine

## 2025-01-14 ENCOUNTER — Encounter: Payer: Self-pay | Admitting: Family Medicine

## 2025-01-14 NOTE — Patient Instructions (Incomplete)

## 2025-01-14 NOTE — Progress Notes (Unsigned)
 "   No chief complaint on file.    HISTORY OF PRESENT ILLNESS:  01/14/25 ALL:  Cynthia Hall is a 69 y.o. female here today for follow up for  MS. She continues Gilenya . Liver enzymes were mildly elevated 06/2021 but have been normal since.  She reports doing well. No new or exacerbating symptoms. She does endorse some imbalance. She has had several falls over the past year. She usually loses her balance and falls to the side. She does not use an assistive device and does not feel it would help. No injuries.   She is sleeping well. She lives alone. She feels mood is good. No vision or bowel/bladder changes.   She is seeing [facility] for PCP needs. She continues alendronate and simvastatin . She continues vitamin D supplements.    HISTORY (copied from Dr Duncan previous note)  Cynthia Hall, is a 69 y.o. woman with multiple sclerosis.   Update 06/12/2024 She discontinued the Gilenya .  She may have had an exacerbation with leg symptoms in 2022 vs progression.       Gait is poor due to reduced balance and mild leg weakness.  She falls 1-2 tines a month due to poor balance.   She feels both legs are mildly weak.   She notes numbness and dysesthesias in her feet.     She has urinary urgency and occasional urge incontinence.  This has worsened the past year (also on Lasix for edema).     She notes reduced visual acuity, right worse then left.  Even with glasses she is about 20/40.  She notes normal color vision.      She does not note much fatigue.    She sleeps well most nights.   She has 1-2 x nocturia.   She has noted mild cognitive issues - especially coming up with the right words.   She has had depression but feels mood is currently fine.     She denies any shoulder pain.  A subacromial bursa injection quickly benefited her and she continues to report that range of motion in the shoulder is good and pain is minimal now.   ROM improved      MS History: She was  diagnosed with MS in 1995 after presenting with right arm numbness and twitching in the arm.  MRI of the brain and spine was consistent with MS.   She felt she did not have MS so did not start any medication.   In 2011 she had more symptoms and had an MRI performed which showed changes c/w MS.   She was initially started on an interferon.   She switched to Copaxone but did not tolerate it well.   She switched to Gilenya  in 2013 and has continued on it.  She feels her MS has been stable and she has no definite exacerbation.   She had left breast cancer and had bilateral mastectomy in 2008.   She did not need chemotherapy.    She has high cholesterol.   She does not have a PCP.       Imaging review: MRI of the brain 07/14/2010 shows multiple T2/FLAIR hyperintense foci predominantly in the periventricular white matter, radially oriented to the ventricles.  One focus is also noted in the right middle cerebellar peduncle adjacent to the ventricle.  None of the foci enhance after contrast was administered.   MRI of the cervical spine 07/14/2010 shows T2 hyperintense foci predominantly in the upper cervical spine.  Foci noted towards the right adjacent to C3 to C4C5 and centrally adjacent to C5.   Spinal stenosis (mild ) at [facility] and C6C7.    MRI cervical spine 01/19/2021 showed T2 hyperintense foci adjacent to C3-C4, C4-C5 and C5-C6 as detailed above.  None of these appear to be acute.   They were all present on the MRI from 2011 and are consistent with chronic demyelinating plaque associated with multiple sclerosis..   Mild to moderate spinal stenosis and foraminal narrowing at C5-C6 due to degenerative changes that have slightly progressed compared to the 2011 MRI..   Milder degenerative changes at C4-C5 and C6-C7 that are stable.  There is no spinal stenosis or nerve root compression at these levels.   MRI brain 01/19/2021 showed Extensive T2/FLAIR single and confluent hyperintense foci predominantly in the  periventricular white matter of the hemispheres.  2 small foci also noted in the medulla.  The extent of the white matter foci is essentially unchanged compared to the 2011 MRI.SABRA    Moderate generalized cortical atrophy, corpus callosum atrophy and cerebellar atrophy that has progressed compared to the 2011 MRI.   REVIEW OF SYSTEMS: Out of a complete 14 system review of symptoms, the patient complains only of the following symptoms, imbalance, falls, and all other reviewed systems are negative.   ALLERGIES: Allergies  Allergen Reactions   Aspirin    Betaseron [Interferon Beta-1b]     inj site fatigue   Biaxin [Clarithromycin]    Codeine    Copaxone [Glatiramer Acetate]     Passed out, incont of bladder/bowel   Effexor [Venlafaxine]    Latex    Penicillins    Sulfa Antibiotics Itching   Topamax [Topiramate]    Zoloft [Sertraline]      HOME MEDICATIONS: Outpatient Medications Prior to Visit  Medication Sig Dispense Refill   alendronate (FOSAMAX) 70 MG tablet Take 70 mg by mouth once a week. Take with a full glass of water on an empty stomach.     B Complex Vitamins (B COMPLEX PO) Take 1 Dose by mouth daily.     Cholecalciferol (VITAMIN D3 PO) Take 2,000 Units by mouth daily.     Fingolimod  HCl (GILENYA ) 0.5 MG CAPS Take 1 capsule (0.5 mg total) by mouth every other day. (Patient not taking: Reported on 06/12/2024) 45 capsule 0   furosemide (LASIX) 20 MG tablet Take 20 mg by mouth daily.     Ginkgo Biloba 120 MG TABS Take 120 mg by mouth daily. (Patient not taking: Reported on 06/12/2024)     hydrochlorothiazide (HYDRODIURIL) 25 MG tablet Take by mouth daily. (Patient not taking: Reported on 06/12/2024)     Multiple Vitamin (MULTIVITAMIN ADULT PO) Take 1 tablet by mouth daily. (Patient not taking: Reported on 06/12/2024)     Multiple Vitamins-Minerals (ZINC PO) Take 50 mg by mouth daily. (Patient not taking: Reported on 06/12/2024)     Omega-3 Fatty Acids (FISH OIL PO) Take 1,400 mg by  mouth daily. (Patient not taking: Reported on 06/12/2024)     Probiotic Product (PROBIOTIC PO) Take 1 Dose by mouth daily.     tolterodine  (DETROL  LA) 4 MG 24 hr capsule Take 1 capsule (4 mg total) by mouth daily. 90 capsule 3   No facility-administered medications prior to visit.     PAST MEDICAL HISTORY: Past Medical History:  Diagnosis Date   Breast cancer (HCC) 2008   Depression    High cholesterol    Multiple sclerosis  PAST SURGICAL HISTORY: Past Surgical History:  Procedure Laterality Date   bladder tack up     BREAST REDUCTION SURGERY     LAPAROSCOPIC ABDOMINAL EXPLORATION     lymph node removal     MASTECTOMY Bilateral    TONSILLECTOMY       FAMILY HISTORY: Family History  Problem Relation Age of Onset   Asthma Mother    Heart attack Father      SOCIAL HISTORY: Social History   Socioeconomic History   Marital status: Widowed    Spouse name: Not on file   Number of children: 0   Years of education: Some college   Highest education level: Not on file  Occupational History   Occupation: Retired  Tobacco Use   Smoking status: Every Day    Current packs/day: 1.50    Types: Cigarettes   Smokeless tobacco: Never  Vaping Use   Vaping status: Never Used  Substance and Sexual Activity   Alcohol use: Yes    Comment: glass of wine daily   Drug use: Never   Sexual activity: Not on file  Other Topics Concern   Not on file  Social History Narrative   Right handed   No caffeine use   Lives alone   Social Drivers of Health   Tobacco Use: High Risk (12/30/2024)   Received from [facility]   Patient History    Smoking Tobacco Use: Every Day    Smokeless Tobacco Use: Never    Passive Exposure: Not on file  Financial Resource Strain: Not on file  Food Insecurity: Low Risk (01/01/2025)   Received from [facility]   Epic    Within the past 12 months, you worried that your food would run out before you got money to buy more: Never true    Within  the past 12 months, the food you bought just didn't last and you didn't have money to get more. : Never true  Transportation Needs: No Transportation Needs (01/01/2025)   Received from [facility]    In the past 12 months, has lack of reliable transportation kept you from medical appointments, meetings, work or from getting things needed for daily living? : No  Physical Activity: Not on file  Stress: Not on file  Social Connections: Not on file  Intimate Partner Violence: Not on file  Depression (EYV7-0): Not on file  Alcohol Screen: Not on file  Housing: Low Risk (01/01/2025)   Received from [facility]   Epic    What is your living situation today?: I have a steady place to live    Think about the place you live. Do you have problems with any of the following? Choose all that apply:: None/None on this list  Utilities: Low Risk (01/01/2025)   Received from [facility]   Utilities    In the past 12 months has the electric, gas, oil, or water company threatened to shut off services in your home? : No  Health Literacy: Not on file     PHYSICAL EXAM  There were no vitals filed for this visit.  There is no height or weight on file to calculate BMI.  Generalized: Well developed, in no acute distress  Cardiology: normal rate and rhythm, no murmur auscultated  Respiratory: clear to auscultation bilaterally    Neurological examination  Mentation: Alert oriented to time, place, history taking. Follows all commands speech and language fluent Cranial nerve II-XII: Pupils were equal round reactive to  light. Extraocular movements were full, visual field were full on confrontational test. Facial sensation and strength were normal. Head turning and shoulder shrug  were normal and symmetric. Motor: The motor testing reveals 5 over 5 strength of all 4 extremities. Good symmetric motor tone is noted throughout.  Sensory: Sensory testing is intact to soft touch on all 4  extremities. No evidence of extinction is noted.  Coordination: Cerebellar testing reveals good finger-nose-finger and heel-to-shin bilaterally.  Gait and station: Gait is mildly unsteady but stable without assistive device. Unable to Tandem  Reflexes: Deep tendon reflexes are symmetric and normal bilaterally.    DIAGNOSTIC DATA (LABS, IMAGING, TESTING) - I reviewed patient records, labs, notes, testing and imaging myself where available.  Lab Results  Component Value Date   WBC 6.7 11/23/2023   HGB 15.9 11/23/2023   HCT 45.0 11/23/2023   MCV 103 (H) 11/23/2023   PLT 310 11/23/2023      Component Value Date/Time   NA 133 (L) 11/23/2023 1458   K 3.9 11/23/2023 1458   CL 90 (L) 11/23/2023 1458   CO2 26 11/23/2023 1458   GLUCOSE 97 11/23/2023 1458   BUN 8 11/23/2023 1458   CREATININE 0.65 11/23/2023 1458   CALCIUM 10.1 11/23/2023 1458   PROT 7.4 11/23/2023 1458   ALBUMIN 4.9 11/23/2023 1458   AST 35 11/23/2023 1458   ALT 25 11/23/2023 1458   ALKPHOS 90 11/23/2023 1458   BILITOT 0.6 11/23/2023 1458   GFRNONAA 96 12/22/2020 1427   GFRAA 111 12/22/2020 1427   Lab Results  Component Value Date   CHOL 213 (H) 11/23/2023   HDL 103 11/23/2023   LDLCALC 97 11/23/2023   TRIG 75 11/23/2023   CHOLHDL 2.1 11/23/2023   No results found for: HGBA1C No results found for: VITAMINB12 Lab Results  Component Value Date   TSH 1.010 12/22/2020        No data to display               No data to display           ASSESSMENT AND PLAN  69 y.o. year old female  has a past medical history of Breast cancer (HCC) (2008), Depression, High cholesterol, and Multiple sclerosis. here with    No diagnosis found.  Kadince is doing well, today. MS symptoms are stable. She will continue Gilenya . I will update labs, today. ASL/ALT mildly elevated at last visit. We will update lipids as they were not completed with last PCP visit. She is aware that simvastatin  refills and lipid  labs will continue through PCP. We have discussed fall risk and precautions were advised. I have encouraged her to use walking stick or cane for stability. Consider PT if symptoms worsen. We have reviewed her BP in office. Repeat BP 180/100. She is asymptomatic. I have encouraged her to notify PCP. I will fax notes as soon as we know who she is seeing. She was advised to seek emergency medical attention immediately for any stroke like symptoms. Low sodium diet discussed. Educational material provided. She will follow up with Dr Vear in 6 months, sooner if needed.    No orders of the defined types were placed in this encounter.    No orders of the defined types were placed in this encounter.     Greig Forbes, MSN, FNP-C 01/14/2025, 9:38 AM  [facility] [address] [place], [place] [address] [phone]  "
